# Patient Record
Sex: Female | Born: 1995 | Race: White | Hispanic: No | Marital: Single | Smoking: Former smoker
Health system: Southern US, Community
[De-identification: ages and names within clinical notes are randomized; demographics above are authoritative.]

## PROBLEM LIST (undated history)

## (undated) DIAGNOSIS — N611 Abscess of the breast and nipple: Secondary | ICD-10-CM

## (undated) DIAGNOSIS — M2669 Other specified disorders of temporomandibular joint: Secondary | ICD-10-CM

## (undated) DIAGNOSIS — F1920 Other psychoactive substance dependence, uncomplicated: Secondary | ICD-10-CM

## (undated) DIAGNOSIS — F32A Depression, unspecified: Secondary | ICD-10-CM

## (undated) DIAGNOSIS — F419 Anxiety disorder, unspecified: Secondary | ICD-10-CM

## (undated) DIAGNOSIS — M26649 Arthritis of unspecified temporomandibular joint: Secondary | ICD-10-CM

## (undated) DIAGNOSIS — B009 Herpesviral infection, unspecified: Secondary | ICD-10-CM

## (undated) DIAGNOSIS — F329 Major depressive disorder, single episode, unspecified: Secondary | ICD-10-CM

## (undated) DIAGNOSIS — R203 Hyperesthesia: Secondary | ICD-10-CM

## (undated) DIAGNOSIS — S43006A Unspecified dislocation of unspecified shoulder joint, initial encounter: Secondary | ICD-10-CM

---
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
## 2016-03-22 ENCOUNTER — Emergency Department (HOSPITAL_COMMUNITY)
Admission: EM | Admit: 2016-03-22 | Discharge: 2016-03-22 | Disposition: A | Discharge status: Home / Self Care | Payer: Medicaid Other | Attending: Emergency Medicine | Admitting: Emergency Medicine

## 2016-03-22 ENCOUNTER — Encounter (HOSPITAL_COMMUNITY): Payer: Self-pay | Admitting: Emergency Medicine

## 2016-03-22 DIAGNOSIS — Z791 Long term (current) use of non-steroidal anti-inflammatories (NSAID): Secondary | ICD-10-CM | POA: Insufficient documentation

## 2016-03-22 DIAGNOSIS — R45851 Suicidal ideations: Secondary | ICD-10-CM | POA: Insufficient documentation

## 2016-03-22 DIAGNOSIS — F1290 Cannabis use, unspecified, uncomplicated: Secondary | ICD-10-CM | POA: Insufficient documentation

## 2016-03-22 DIAGNOSIS — F17210 Nicotine dependence, cigarettes, uncomplicated: Secondary | ICD-10-CM | POA: Diagnosis not present

## 2016-03-22 DIAGNOSIS — Z79899 Other long term (current) drug therapy: Secondary | ICD-10-CM | POA: Insufficient documentation

## 2016-03-22 HISTORY — DX: Other specified disorders of temporomandibular joint: M26.69

## 2016-03-22 HISTORY — DX: Arthritis of unspecified temporomandibular joint: M26.649

## 2016-03-22 LAB — COMPREHENSIVE METABOLIC PANEL
ALT: 32 U/L (ref 14–54)
ANION GAP: 7 (ref 5–15)
AST: 23 U/L (ref 15–41)
Albumin: 4.4 g/dL (ref 3.5–5.0)
Alkaline Phosphatase: 118 U/L (ref 38–126)
BUN: <5 mg/dL — ABNORMAL LOW (ref 6–20)
CALCIUM: 8.9 mg/dL (ref 8.9–10.3)
CO2: 26 mmol/L (ref 22–32)
Chloride: 105 mmol/L (ref 101–111)
Creatinine, Ser: 0.78 mg/dL (ref 0.44–1.00)
GFR calc non Af Amer: >60 mL/min (ref >60)
Glucose, Bld: 85 mg/dL (ref 65–99)
Potassium: 3.1 mmol/L — ABNORMAL LOW (ref 3.5–5.1)
SODIUM: 138 mmol/L (ref 135–145)
Total Bilirubin: 1.1 mg/dL (ref 0.3–1.2)
Total Protein: 7.6 g/dL (ref 6.5–8.1)

## 2016-03-22 LAB — CBC
HCT: 44.1 % (ref 36.0–46.0)
Hemoglobin: 15.8 g/dL — ABNORMAL HIGH (ref 12.0–15.0)
MCH: 31.1 pg (ref 26.0–34.0)
MCHC: 35.8 g/dL (ref 30.0–36.0)
MCV: 86.8 fL (ref 78.0–100.0)
PLATELETS: 398 10*3/uL (ref 150–400)
RBC: 5.08 MIL/uL (ref 3.87–5.11)
RDW: 12.2 % (ref 11.5–15.5)
WBC: 10.1 10*3/uL (ref 4.0–10.5)

## 2016-03-22 LAB — SALICYLATE LEVEL

## 2016-03-22 LAB — RAPID URINE DRUG SCREEN, HOSP PERFORMED
Amphetamines: NOT DETECTED
Barbiturates: NOT DETECTED
Benzodiazepines: POSITIVE — AB
Cocaine: NOT DETECTED
OPIATES: NOT DETECTED
Tetrahydrocannabinol: POSITIVE — AB

## 2016-03-22 LAB — ETHANOL

## 2016-03-22 LAB — I-STAT BETA HCG BLOOD, ED (MC, WL, AP ONLY): I-stat hCG, quantitative: <5 m[IU]/mL (ref <5)

## 2016-03-22 LAB — ACETAMINOPHEN LEVEL: Acetaminophen (Tylenol), Serum: <10 ug/mL — ABNORMAL LOW (ref 10–30)

## 2016-03-22 MED ORDER — TRAZODONE HCL 100 MG PO TABS: 100.0000 mg | ORAL_TABLET | Freq: Every day | ORAL | Status: DC

## 2016-03-22 MED ORDER — HYDROXYZINE HCL 25 MG PO TABS: 25.0000 mg | ORAL_TABLET | Freq: Four times a day (QID) | ORAL | 0 refills | Status: DC

## 2016-03-22 MED ORDER — HYDROXYZINE HCL 25 MG PO TABS: 25.0000 mg | ORAL_TABLET | Freq: Four times a day (QID) | ORAL | Status: DC | PRN

## 2016-03-22 MED ORDER — TRAZODONE HCL 100 MG PO TABS: 100.0000 mg | ORAL_TABLET | Freq: Every day | ORAL | 0 refills | Status: DC

## 2016-03-22 MED ORDER — POTASSIUM CHLORIDE CRYS ER 20 MEQ PO TBCR
40.0000 meq | EXTENDED_RELEASE_TABLET | Freq: Once | ORAL | Status: AC
  Administered 2016-03-22: 40 meq via ORAL
  Filled 2016-03-22: qty 2

## 2016-03-22 NOTE — ED Notes (Signed)
Patient discharged to home.  Denies thoughts of harm to self or others.  States she has chronic anxiety and depression.  She was given resources for outpatient follow up.  All belongings returned and signed for.  She left the unit ambulatory and was escorted to the front lobby.

## 2016-03-22 NOTE — ED Triage Notes (Signed)
Pt reports she had SI earlier today, none at present. Has been under increased stress due to marital difficulties. Denies HI. Uses marijuana occasionally. Does not have mental health provider in the community.

## 2016-03-22 NOTE — ED Provider Notes (Signed)
[facility]-EMERGENCY DEPT Provider Note   CSN: [id number] Arrival date & time: 03/22/16  1528  First Provider Contact:  First MD Initiated Contact with Patient 03/22/16 1630        History   Chief Complaint Chief Complaint  Patient presents with  . Suicidal    HPI Kelli Wright is a 20 y.o. female.  HPI Kelli Wright is a 20 y.o. female with PMH significant for anxiety and depression who presents with sudden onset, severe, now resolved SI.  Patient reports increased marital stressors and earlier today she stated she did not want to be alive anymore if the relationship was over.  Denies suicidal plan.  No HI or AVH.  No other complaints.  Endorses occassional THC use.  She is requesting mental health resources.  Past Medical History:  Diagnosis Date  . Anxiety   . Depression   . Dislocation, shoulder closed 08/2013   left - injured in a fall  . Herpes   . Sensitive skin   . TMJ arthritis     There are no active problems to display for this patient.   Past Surgical History:  Procedure Laterality Date  . SHOULDER ARTHROSCOPY WITH BANKART REPAIR Left 11/22/2013   Procedure: LEFT SHOULDER ARTHROSCOPY WITH BANKART REPAIR/EXTENSIVE DEBRIDEMENT AND SUBACROMIAL DECOMPRESSION;  Surgeon: Sheral Apley, MD;  Location: [facility];  Service: Orthopedics;  Laterality: Left;    OB History    No data available       Home Medications    Prior to Admission medications   Medication Sig Start Date End Date Taking? Authorizing Provider  meloxicam (MOBIC) 7.5 MG tablet TAKE 1 TABLET BY MOUTH ONCE A DAY 03/09/16  Yes Historical Provider, MD  tiZANidine (ZANAFLEX) 2 MG tablet TAKE 1 TABLET BY MOUTH 3 TIMES A DAY AS NEEDED FOR TIGHT MUSCLES 03/09/16  Yes Historical Provider, MD  docusate sodium (COLACE) 100 MG capsule Take 1 capsule (100 mg total) by mouth 2 (two) times daily. Continue this while taking narcotics to help with bowel movements Patient not taking: Reported on  03/22/2016 11/22/13   Sheral Apley, MD  meloxicam (MOBIC) 15 MG tablet Take 1 tablet (15 mg total) by mouth daily. Patient not taking: Reported on 03/22/2016 07/23/15   Mercedes Camprubi-Soms, PA-C  naproxen (NAPROSYN) 500 MG tablet Take 1 tablet (500 mg total) by mouth 2 (two) times daily. Patient not taking: Reported on 03/22/2016 11/24/14   Joycie Peek, PA-C  ondansetron (ZOFRAN) 4 MG tablet Take 1 tablet (4 mg total) by mouth every 8 (eight) hours as needed for nausea. Patient not taking: Reported on 03/22/2016 11/22/13   Sheral Apley, MD  oxyCODONE (ROXICODONE) 5 MG immediate release tablet Take 2 tablets (10 mg total) by mouth every 4 (four) hours as needed for severe pain. Patient not taking: Reported on 03/22/2016 11/22/13   Sheral Apley, MD  triamcinolone cream (KENALOG) 0.1 % Apply 1 application topically 4 (four) times daily as needed. Patient not taking: Reported on 03/22/2016 11/24/14   Joycie Peek, PA-C    Family History Family History  Problem Relation Age of Onset  . Hypertension Paternal Grandmother     Social History Social History  Substance Use Topics  . Smoking status: Current Every Day Smoker    Packs/day: 0.50    Years: 3.00    Types: Cigarettes  . Smokeless tobacco: Never Used     Comment: 1 pack/week  . Alcohol use No  Comment:        Allergies   Keflex [cephalexin]   Review of Systems Review of Systems All other systems negative unless otherwise stated in HPI   Physical Exam Updated Vital Signs BP 126/82 (BP Location: Left Arm)   Pulse 99   Temp 98.2 F (36.8 C) (Oral)   Resp 18   SpO2 100%   Physical Exam  Constitutional: She is oriented to person, place, and time. She appears well-developed and well-nourished.  HENT:  Head: Normocephalic and atraumatic.  Right Ear: External ear normal.  Left Ear: External ear normal.  Eyes: Conjunctivae are normal. No scleral icterus.  Neck: No tracheal deviation present.  Cardiovascular:  Normal rate, regular rhythm and normal heart sounds.   Pulmonary/Chest: Effort normal and breath sounds normal. No respiratory distress.  Abdominal: Soft. Bowel sounds are normal. She exhibits no distension.  Musculoskeletal: Normal range of motion.  Neurological: She is alert and oriented to person, place, and time.  Skin: Skin is warm and dry.  Psychiatric: Her speech is normal and behavior is normal. Cognition and memory are normal. She expresses inappropriate judgment. She exhibits a depressed mood. She expresses suicidal ideation. She expresses no homicidal ideation. She expresses no suicidal plans and no homicidal plans.     ED Treatments / Results  Labs (all labs ordered are listed, but only abnormal results are displayed) Labs Reviewed  COMPREHENSIVE METABOLIC PANEL - Abnormal; Notable for the following:       Result Value   Potassium 3.1 (*)    BUN <5 (*)    All other components within normal limits  ACETAMINOPHEN LEVEL - Abnormal; Notable for the following:    Acetaminophen (Tylenol), Serum <10 (*)    All other components within normal limits  CBC - Abnormal; Notable for the following:    Hemoglobin 15.8 (*)    All other components within normal limits  URINE RAPID DRUG SCREEN, HOSP PERFORMED - Abnormal; Notable for the following:    Benzodiazepines POSITIVE (*)    Tetrahydrocannabinol POSITIVE (*)    All other components within normal limits  ETHANOL  SALICYLATE LEVEL  I-STAT BETA HCG BLOOD, ED ([facility], [facility], [facility] ONLY)    EKG  EKG Interpretation None       Radiology No results found.  Procedures Procedures (including critical care time)  Medications Ordered in ED Medications  hydrOXYzine (ATARAX/VISTARIL) tablet 25 mg (not administered)  traZODone (DESYREL) tablet 100 mg (not administered)  potassium chloride SA (K-DUR,KLOR-CON) CR tablet 40 mEq (40 mEq Oral Given 03/22/16 1708)     Initial Impression / Assessment and Plan / ED Course  I have reviewed the  triage vital signs and the nursing notes.  Pertinent labs & imaging results that were available during my care of the patient were reviewed by me and considered in my medical decision making (see chart for details).  Clinical Course   Patient with hx of anxiety and depression who presents with now resolved SI earlier today due to increased marital stressors.  No other complaints.  She does not have mental health resources.  Medical screening labs obtained and will consult TTS. K 3.1, repleted in ED.  Labs without acute abnormalities, patient medically cleared and moved to [facility] room.   Final Clinical Impressions(s) / ED Diagnoses   Final diagnoses:  Suicidal thoughts    New Prescriptions New Prescriptions   No medications on file     Cheri Fowler, Cordelia Poche 03/22/16 1725    Bethann Berkshire, MD  03/22/16 2309  

## 2016-03-22 NOTE — [facility] Counselor (Signed)
Writer discussed pt with Nanine Means DNP who recommends outpatient MH treatment as pt doesn't meet inpatient criteria. Writer notified [facility]. Writer then discussed MH treatment options with pt and gave pt contact info for [facility], [place] and other outpatient facilities. Pt will be d/c after shift change.   Evette Cristal, [place] Therapeutic Triage Specialist

## 2016-03-22 NOTE — Discharge Instructions (Signed)
Please use the resources provided to establish outpatient care.  You may start taking Trazodone at bedtime daily and Atarax every 6 hours as needed for anxiety.  Return to the ED if you experience increased suicidal ideations, plan, or feeling like you want to hurt others.
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
## 2016-05-12 ENCOUNTER — Encounter (HOSPITAL_COMMUNITY): Payer: Self-pay

## 2016-05-12 ENCOUNTER — Observation Stay (HOSPITAL_COMMUNITY)
Admission: RE | Admit: 2016-05-12 | Discharge: 2016-05-13 | Disposition: A | Discharge status: Home / Self Care | Payer: Medicare Other | Attending: Psychiatry | Admitting: Psychiatry

## 2016-05-12 DIAGNOSIS — F410 Panic disorder [episodic paroxysmal anxiety] without agoraphobia: Secondary | ICD-10-CM | POA: Insufficient documentation

## 2016-05-12 DIAGNOSIS — M1388 Other specified arthritis, other site: Secondary | ICD-10-CM | POA: Insufficient documentation

## 2016-05-12 DIAGNOSIS — F17210 Nicotine dependence, cigarettes, uncomplicated: Secondary | ICD-10-CM | POA: Insufficient documentation

## 2016-05-12 DIAGNOSIS — R45851 Suicidal ideations: Secondary | ICD-10-CM | POA: Diagnosis not present

## 2016-05-12 DIAGNOSIS — Z881 Allergy status to other antibiotic agents status: Secondary | ICD-10-CM | POA: Diagnosis not present

## 2016-05-12 DIAGNOSIS — F332 Major depressive disorder, recurrent severe without psychotic features: Secondary | ICD-10-CM | POA: Diagnosis not present

## 2016-05-12 MED ORDER — MELOXICAM 7.5 MG PO TABS
7.5000 mg | ORAL_TABLET | Freq: Every day | ORAL | Status: DC
  Administered 2016-05-12 – 2016-05-13 (×2): 7.5 mg via ORAL
  Filled 2016-05-12 (×2): qty 1

## 2016-05-12 MED ORDER — TRAZODONE HCL 100 MG PO TABS
100.0000 mg | ORAL_TABLET | Freq: Every evening | ORAL | Status: DC | PRN
  Administered 2016-05-12: 100 mg via ORAL
  Filled 2016-05-12: qty 1

## 2016-05-12 MED ORDER — ACETAMINOPHEN 325 MG PO TABS
650.0000 mg | ORAL_TABLET | Freq: Four times a day (QID) | ORAL | Status: DC | PRN
  Administered 2016-05-12: 650 mg via ORAL
  Filled 2016-05-12: qty 2

## 2016-05-12 MED ORDER — MAGNESIUM HYDROXIDE 400 MG/5ML PO SUSP: 30.0000 mL | Freq: Every day | ORAL | Status: DC | PRN

## 2016-05-12 MED ORDER — ALUM & MAG HYDROXIDE-SIMETH 200-200-20 MG/5ML PO SUSP: 30.0000 mL | ORAL | Status: DC | PRN

## 2016-05-12 MED ORDER — NAPROXEN 500 MG PO TABS
500.0000 mg | ORAL_TABLET | Freq: Two times a day (BID) | ORAL | Status: DC
  Administered 2016-05-12 – 2016-05-13 (×2): 500 mg via ORAL
  Filled 2016-05-12 (×2): qty 1

## 2016-05-12 NOTE — Progress Notes (Signed)
Pt awake and alert on unit in bed 6.  Pt friendly and cooperative.  Pt has good eye contact and answers questions and smiles.  Pt sts she does feel depressed and anxious.  Pt denies SI plan, HI and AVH.  Pt verbally contracts for safety.  No self injurious behavior noted.  Pt confirms mild discomfort with recent wisdom tooth extraction.   Pt continuously observed for safety on unit except when in bathroom. Pt resting and watching TV and using phone.

## 2016-05-12 NOTE — BHH Counselor (Signed)
This Clinical research associatewriter spoke with the patient in regards to her admission. Patient informed this Clinical research associatewriter that she has been feeling depressed and feeling anxiety. Along with having passive suicidal ideation and relationship issues with her spouse. This patient also reports that she does not currently have a therapist or psychiatrist. This patient is receptive to following up with a therapist or psychiatrist post discharge.
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
## 2016-05-12 NOTE — H&P (Signed)
Behavioral Health Medical Screening Exam  Margarita SermonsHarleigh D Wright is an 20 y.o. female.  Total Time spent with patient: 15 minutes  Psychiatric Specialty Exam: Physical Exam  Review of Systems  Psychiatric/Behavioral: Positive for depression and suicidal ideas (intermittent). Negative for hallucinations and substance abuse. The patient is nervous/anxious and has insomnia.   All other systems reviewed and are negative.   Blood pressure 113/80, pulse 96, temperature 98.4 F (36.9 C), temperature source Oral, resp. rate 16, height 5\' 3"  (1.6 m), weight 80.7 kg (178 lb), SpO2 100 %.Body mass index is 31.53 kg/m.  General Appearance: Casual and Fairly Groomed  Eye Contact:  Good  Speech:  Clear and Coherent and Normal Rate  Volume:  Normal  Mood:  Anxious and Depressed  Affect:  Appropriate, Congruent and Depressed  Thought Process:  Coherent, Goal Directed, Linear and Descriptions of Associations: Intact  Orientation:  Full (Time, Place, and Person)  Thought Content:  Anxiety symptoms  Suicidal Thoughts:  intermittent  Homicidal Thoughts:  No  Memory:  Immediate;   Fair Recent;   Fair Remote;   Fair  Judgement:  Fair  Insight:  Fair  Psychomotor Activity:  Normal  Concentration: Concentration: Fair and Attention Span: Fair  Recall:  [employer]Fair  Fund of Knowledge:Fair  Language: Fair  Akathisia:  No  Handed:    AIMS (if indicated):     Assets:  Communication Skills Desire for Improvement Physical Health Resilience Social Support  Sleep:       Musculoskeletal: Strength & Muscle Tone: within normal limits Gait & Station: normal Patient leans: N/A  Blood pressure 113/80, pulse 96, temperature 98.4 F (36.9 C), temperature source Oral, resp. rate 16, height 5\' 3"  (1.6 m), weight 80.7 kg (178 lb), SpO2 100 %.  Recommendations:  Based on my evaluation the patient does not appear to have an emergency medical condition.  Beau FannyWithrow, John C, FNP 05/12/2016, 6:24 PM   Agree with NP note  and assessment

## 2016-05-13 DIAGNOSIS — F332 Major depressive disorder, recurrent severe without psychotic features: Principal | ICD-10-CM

## 2016-05-13 MED ORDER — TRAZODONE HCL 100 MG PO TABS: 100.0000 mg | ORAL_TABLET | Freq: Every evening | ORAL | 0 refills | Status: DC | PRN

## 2016-05-13 NOTE — BHH Counselor (Signed)
This pt has a follow up appointment scheduled for today at 3pm with [facility] with Tilden FossaQuandra Smith (Therapist).

## 2016-05-13 NOTE — Discharge Summary (Signed)
[facility] OBS UNIT DISCHARGE SUMMARY (pt seen once due to short LOS)  Patient Identification: Kelli Wright MRN:  [id number] Principal Diagnosis: MDD (major depressive disorder), recurrent severe, without psychosis (HCC) Diagnosis:   Patient Active Problem List   Diagnosis Date Noted  . MDD (major depressive disorder), recurrent severe, without psychosis (HCC) [F33.2] 05/12/2016    Priority: High    Total Time spent with patient: 45 minutes  Discharge update: Pt has a psychiatry appointment today at 3pm and will discharge now to make this appointment.   Subjective:   Kelli Wright is a 20 y.o. female patient admitted with severe anxiety and passive intermittent suicidal thoughts but able to contract for safety. Pt seen and chart reviewed. Pt is alert/oriented x4, calm, cooperative, and appropriate to situation. Pt denies suicidal/homicidal ideation and psychosis and does not appear to be responding to internal stimuli. Pt arrived late yesterday and spent the night in the [facility] OBS UNIT without incident. Pt reports that she slept well and feels much better today.   HPI:  I have reviewed and concur with HPI elements below, modified as follows: Kelli Wright is a 20 y.o. female with a history of depressive and anxious symptoms who presented as a voluntary walk-in to [facility] with complaint of passive suicidal ideation and other depressive symptoms.  Pt reported as follows:  Pt stated she has "always had" depression and anxiety, and that over the last month, her symptoms have become exacerbated due to marital conflict.  Pt endorsed passive suicidal ideation (generalized desire to be dead), persistent and unremitting sadness, tearfulness, feelings of hopelessness and worthlessness, fatigue, guilt, and worthlessness.  In addition to these symptoms, Pt endorsed anxiety and said that she has panic attacks (most recent panic attack was 05/12/16).  Pt presented today to [facility] today after her husband threatened to  leave her.    Pt is not currently in treatment for these symptoms.  She stated that she has been treated outpatient previously.  She denied any current psychotropic medication.  During assessment, Pt was dressed in street clothes and appeared appropriately groomed.  She had good eye contact and was cooperative.  Pt's mood was depressed, and she was tearful in affect.  Pt reported passive suicidal ideation, and she stated that she attempted suicide once before.  Pt also endorsed depressive and anxious symptoms (see above).  Pt denied auditory/visual hallucination and homicidal ideation.  She endorsed a history of cutting, but denied any recent self-injury.  Likewise, she reported a history of marijuana use, but denied current use.  Pt's speech was normal in rate, rhythm, and volume.  Memory and concentration were intact.  Pt's thought processes were within normal range.  Thought content was goal-oriented.  There was no evidence of delusion.  Pt's insight, judgment, and impulse control were fair.  Pt spent the night in [facility] OBS UNIT as a late arrival yesterday. Pt has been participating in the milieu and evaluated as above.   Past Psychiatric History: MDD  Risk to Self: Suicidal Ideation: No Suicidal Intent: No Is patient at risk for suicide?: Yes Suicidal Plan?: No Access to Means: No What has been your use of drugs/alcohol within the last 12 months?: Marijuana How many times?: 1 Other Self Harm Risks: Drug use hx Triggers for Past Attempts: Spouse contact Intentional Self Injurious Behavior: Cutting Comment - Self Injurious Behavior: Hx of cutting Risk to Others: Homicidal Ideation: No Thoughts of Harm to Others: No Current Homicidal Intent: No Current Homicidal  Plan: No Access to Homicidal Means: No History of harm to others?: No Assessment of Violence: None Noted Does patient have access to weapons?: No Criminal Charges Pending?: Yes Describe Pending Criminal Charges: Possession of  THC Does patient have a court date: Yes Court Date: 05/31/16 Prior Inpatient Therapy: Prior Inpatient Therapy: No Prior Outpatient Therapy: Prior Outpatient Therapy: Yes Prior Therapy Dates: Unknown Prior Therapy Facilty/Provider(s): [facility]; [facility] Reason for Treatment: Depression, Anxiety Does patient have an ACCT team?: No Does patient have Intensive In-House Services?  : No Does patient have [facility] services? : No Does patient have P4CC services?: No  Past Medical History:  Past Medical History:  Diagnosis Date  . Anxiety   . Depression   . Dislocation, shoulder closed 08/2013   left - injured in a fall  . Herpes   . Sensitive skin   . TMJ arthritis     Past Surgical History:  Procedure Laterality Date  . SHOULDER ARTHROSCOPY WITH BANKART REPAIR Left 11/22/2013   Procedure: LEFT SHOULDER ARTHROSCOPY WITH BANKART REPAIR/EXTENSIVE DEBRIDEMENT AND SUBACROMIAL DECOMPRESSION;  Surgeon: Sheral Apleyimothy D Murphy, MD;  Location: [facility];  Service: Orthopedics;  Laterality: Left;   Family History:  Family History  Problem Relation Age of Onset  . Hypertension Paternal Grandmother    Family Psychiatric  History: MDD Social History:  History  Alcohol Use No    Comment:        History  Drug Use  . Types: Marijuana    Comment: States she recently quit but did use daily    Social History   Social History  . Marital status: Single    Spouse name: N/A  . Number of children: N/A  . Years of education: N/A   Social History Main Topics  . Smoking status: Current Every Day Smoker    Packs/day: 0.25    Years: 3.00    Types: Cigarettes  . Smokeless tobacco: Never Used     Comment: 1 pack/week  . Alcohol use No     Comment:     . Drug use:     Types: Marijuana     Comment: States she recently quit but did use daily  . Sexual activity: Yes    Partners: Male    Birth control/ protection: Condom   Other Topics Concern  . None   Social History Narrative    Paternal grandmother is legal guardian; to bring guardianship papers DOS   Additional Social History:    Allergies:   Allergies  Allergen Reactions  . Keflex [Cephalexin] Rash    Labs: No results found for this or any previous visit (from the past 48 hour(s)).  Current Facility-Administered Medications  Medication Dose Route Frequency Provider Last Rate Last Dose  . acetaminophen (TYLENOL) tablet 650 mg  650 mg Oral Q6H PRN Beau FannyJohn C Withrow, FNP   650 mg at 05/12/16 2205  . alum & mag hydroxide-simeth (MAALOX/MYLANTA) 200-200-20 MG/5ML suspension 30 mL  30 mL Oral Q4H PRN Beau FannyJohn C Withrow, FNP      . magnesium hydroxide (MILK OF MAGNESIA) suspension 30 mL  30 mL Oral Daily PRN Beau FannyJohn C Withrow, FNP      . meloxicam (MOBIC) tablet 7.5 mg  7.5 mg Oral Daily Beau FannyJohn C Withrow, FNP   7.5 mg at 05/13/16 [id number]  . naproxen (NAPROSYN) tablet 500 mg  500 mg Oral BID Beau FannyJohn C Withrow, FNP   500 mg at 05/13/16 [id number]  . traZODone (DESYREL) tablet 100 mg  100 mg Oral QHS PRN Jackelyn Poling, NP   100 mg at 05/12/16 2205    Musculoskeletal: Strength & Muscle Tone: within normal limits Gait & Station: normal Patient leans: N/A  Psychiatric Specialty Exam: Physical Exam  ROS  Blood pressure 109/68, pulse (!) 129, temperature 98.2 F (36.8 C), resp. rate 18, height 5\' 3"  (1.6 m), weight 80.7 kg (178 lb), SpO2 100 %.Body mass index is 31.53 kg/m.  General Appearance: Casual and Fairly Groomed  Eye Contact:  Good  Speech:  Clear and Coherent and Normal Rate  Volume:  Normal  Mood:  Depressed  Affect:  Appropriate, Congruent and Depressed  Thought Process:  Coherent, Goal Directed, Linear and Descriptions of Associations: Intact  Orientation:  Full (Time, Place, and Person)  Thought Content:  Discharge plans  Suicidal Thoughts:  No  Homicidal Thoughts:  No  Memory:  Immediate;   Fair Recent;   Fair Remote;   Fair  Judgement:  Fair  Insight:  Fair  Psychomotor Activity:  Normal  Concentration:   Concentration: Fair and Attention Span: Fair  Recall:  [employer] of Knowledge:  Fair  Language:  Fair  Akathisia:  No  Handed:    AIMS (if indicated):     Assets:  Communication Skills Desire for Improvement Resilience Social Support Talents/Skills  ADL's:  Intact  Cognition:  WNL  Sleep:      Treatment Plan Summary: MDD (major depressive disorder), recurrent severe, without psychosis (HCC)  Stable for management outpatient. Continue plan as below:  Medications:  -Continue Trazodone 100mg  po qhs (rx for 14 days given)  Disposition: -Discharge home -Followup with outpatient psychiatry (appointment today at 3pm)  Beau Fanny, FNP 05/13/2016 12:26 PM    Agree with NP discharge note as above

## 2016-05-13 NOTE — Progress Notes (Signed)
D: Pt A & O X4. Denies SI, HI, AVH and pain at present. Presents with depressed affect and mood. Reported being worried about her husband who was recently diagnosed with Bipolar last week but is refusing to take medications. Ambulatory with a steady gait. Pt discharged from Observation Unit as ordered and was picked up by family in the lobby.  A: Emotional support and availability provided to pt. Encouraged to voice concerns and comply with d/c instructions. Scheduled medications administered as prescribed. D/C instructions, prescriptions and AVS reviewed with pt. All belongings in locker 41 returned to pt at time of departure. Safety maintained in Observation unit till time of d/c without self harm gestures or outburst to note at present.  R: Pt receptive to care. Compliant with medications as ordered. Denies adverse drug reactions when assessed. Tolerates all PO intake well. Verbalized understanding related to d/c instructions. Signed belonging sheet in agreement with items received from locker. No physical distress evident at time of d/c.

## 2016-05-13 NOTE — H&P (Signed)
[facility] OBS UNIT H&P   Patient Identification: Kelli Wright MRN:  [id number] Principal Diagnosis: MDD (major depressive disorder), recurrent severe, without psychosis (HCC) Diagnosis:   Patient Active Problem List   Diagnosis Date Noted  . MDD (major depressive disorder), recurrent severe, without psychosis (HCC) [F33.2] 05/12/2016    Priority: High    Total Time spent with patient: 45 minutes  Subjective:   Kelli SermonsHarleigh D Sporn is a 20 y.o. female patient admitted with severe anxiety and passive intermittent suicidal thoughts but able to contract for safety. Pt seen and chart reviewed. Pt is alert/oriented x4, calm, cooperative, and appropriate to situation. Pt denies suicidal/homicidal ideation and psychosis and does not appear to be responding to internal stimuli. Pt arrived late yesterday [facility]. Pt reports that she slept well and feels much better today.   HPI:  I have reviewed and concur with HPI elements below, modified as follows: Kelli SermonsHarleigh D Mroz is a 20 y.o. female with a history of depressive and anxious symptoms who [facility] complaint of passive suicidal ideation and other depressive symptoms.  Pt reported as follows:  Pt stated she has "always had" depression and anxiety, and that over the last month, her symptoms have become exacerbated due to marital conflict.  Pt endorsed passive suicidal ideation (generalized desire to be dead), persistent and unremitting sadness, tearfulness, feelings of hopelessness and worthlessness, fatigue, guilt, and worthlessness.  In addition to these symptoms, Pt endorsed anxiety and said that she has panic attacks (most recent panic attack was 05/12/16).  [facility] after her husband threatened to leave her.    Pt is not currently in treatment for these symptoms.  She stated that she has been treated outpatient previously.  She denied any current  psychotropic medication.  During assessment, Pt was dressed in street clothes and appeared appropriately groomed.  She had good eye contact and was cooperative.  Pt's mood was depressed, and she was tearful in affect.  Pt reported passive suicidal ideation, and she stated that she attempted suicide once before.  Pt also endorsed depressive and anxious symptoms (see above).  Pt denied auditory/visual hallucination and homicidal ideation.  She endorsed a history of cutting, but denied any recent self-injury.  Likewise, she reported a history of marijuana use, but denied current use.  Pt's speech was normal in rate, rhythm, and volume.  Memory and concentration were intact.  Pt's thought processes were within normal range.  Thought content was goal-oriented.  There was no evidence of delusion.  Pt's insight, judgment, and impulse control were fair.  [facility]. Pt has been participating in the milieu and evaluated as above.   Past Psychiatric History: MDD  Risk to Self: Suicidal Ideation: No Suicidal Intent: No Is patient at risk for suicide?: Yes Suicidal Plan?: No Access to Means: No What has been your use of drugs/alcohol within the last 12 months?: Marijuana How many times?: 1 Other Self Harm Risks: Drug use hx Triggers for Past Attempts: Spouse contact Intentional Self Injurious Behavior: Cutting Comment - Self Injurious Behavior: Hx of cutting Risk to Others: Homicidal Ideation: No Thoughts of Harm to Others: No Current Homicidal Intent: No Current Homicidal Plan: No Access to Homicidal Means: No History of harm to others?: No Assessment of Violence: None Noted Does patient have access to weapons?: No Criminal Charges  Pending?: Yes Describe Pending Criminal Charges: Possession of THC Does patient have a court date: Yes Court Date: 05/31/16 Prior Inpatient Therapy: Prior Inpatient Therapy: No Prior Outpatient Therapy: Prior  Outpatient Therapy: Yes Prior Therapy Dates: Unknown Prior Therapy Facilty/Provider(s): [facility]; [facility] Reason for Treatment: Depression, Anxiety Does patient have an ACCT team?: No Does patient have Intensive In-House Services?  : No Does patient have [facility] services? : No Does patient have P4CC services?: No  Past Medical History:  Past Medical History:  Diagnosis Date  . Anxiety   . Depression   . Dislocation, shoulder closed 08/2013   left - injured in a fall  . Herpes   . Sensitive skin   . TMJ arthritis     Past Surgical History:  Procedure Laterality Date  . SHOULDER ARTHROSCOPY WITH BANKART REPAIR Left 11/22/2013   Procedure: LEFT SHOULDER ARTHROSCOPY WITH BANKART REPAIR/EXTENSIVE DEBRIDEMENT AND SUBACROMIAL DECOMPRESSION;  Surgeon: Sheral Apley, MD;  Location: [facility];  Service: Orthopedics;  Laterality: Left;   Family History:  Family History  Problem Relation Age of Onset  . Hypertension Paternal Grandmother    Family Psychiatric  History: MDD Social History:  History  Alcohol Use No    Comment:        History  Drug Use  . Types: Marijuana    Comment: States she recently quit but did use daily    Social History   Social History  . Marital status: Single    Spouse name: N/A  . Number of children: N/A  . Years of education: N/A   Social History Main Topics  . Smoking status: Current Every Day Smoker    Packs/day: 0.25    Years: 3.00    Types: Cigarettes  . Smokeless tobacco: Never Used     Comment: 1 pack/week  . Alcohol use No     Comment:     . Drug use:     Types: Marijuana     Comment: States she recently quit but did use daily  . Sexual activity: Yes    Partners: Male    Birth control/ protection: Condom   Other Topics Concern  . None   Social History Narrative   Paternal grandmother is legal guardian; to bring guardianship papers DOS   Additional Social History:    Allergies:   Allergies  Allergen  Reactions  . Keflex [Cephalexin] Rash    Labs: No results found for this or any previous visit (from the past 48 hour(s)).  Current Facility-Administered Medications  Medication Dose Route Frequency Provider Last Rate Last Dose  . acetaminophen (TYLENOL) tablet 650 mg  650 mg Oral Q6H PRN Beau Fanny, FNP   650 mg at 05/12/16 2205  . alum & mag hydroxide-simeth (MAALOX/MYLANTA) 200-200-20 MG/5ML suspension 30 mL  30 mL Oral Q4H PRN Beau Fanny, FNP      . magnesium hydroxide (MILK OF MAGNESIA) suspension 30 mL  30 mL Oral Daily PRN Beau Fanny, FNP      . meloxicam (MOBIC) tablet 7.5 mg  7.5 mg Oral Daily Beau Fanny, FNP   7.5 mg at 05/13/16 1610  . naproxen (NAPROSYN) tablet 500 mg  500 mg Oral BID Beau Fanny, FNP   500 mg at 05/13/16 9604  . traZODone (DESYREL) tablet 100 mg  100 mg Oral QHS PRN Jackelyn Poling, NP   100 mg at 05/12/16 2205    Musculoskeletal: Strength & Muscle Tone: within normal limits  Gait & Station: normal Patient leans: N/A  Psychiatric Specialty Exam: Physical Exam  ROS  Blood pressure 109/68, pulse (!) 129, temperature 98.2 F (36.8 C), resp. rate 18, height 5\' 3"  (1.6 m), weight 80.7 kg (178 lb), SpO2 100 %.Body mass index is 31.53 kg/m.  General Appearance: Casual and Fairly Groomed  Eye Contact:  Good  Speech:  Clear and Coherent and Normal Rate  Volume:  Normal  Mood:  Depressed  Affect:  Appropriate, Congruent and Depressed  Thought Process:  Coherent, Goal Directed, Linear and Descriptions of Associations: Intact  Orientation:  Full (Time, Place, and Person)  Thought Content:  Discharge plans  Suicidal Thoughts:  No  Homicidal Thoughts:  No  Memory:  Immediate;   Fair Recent;   Fair Remote;   Fair  Judgement:  Fair  Insight:  Fair  Psychomotor Activity:  Normal  Concentration:  Concentration: Fair and Attention Span: Fair  Recall:  [employer] of Knowledge:  Fair  Language:  Fair  Akathisia:  No  Handed:    AIMS (if  indicated):     Assets:  Communication Skills Desire for Improvement Resilience Social Support Talents/Skills  ADL's:  Intact  Cognition:  WNL  Sleep:      Treatment Plan Summary: MDD (major depressive disorder), recurrent severe, without psychosis (HCC)  Stable for management outpatient. Continue plan as below:  Medications:  -Continue Trazodone 100mg  po qhs  Disposition: SW seeking outpatient followup for pt at this time. Will discharge today after this is set up.   Beau Fanny, FNP 05/13/2016 9:09 AM  Agree with NP note and assessment as above

## 2016-05-13 NOTE — [facility] Counselor (Signed)
This Clinical research associatewriter spoke with pt in regards to obtaining written consent to fax over her paperwork to [facility] for a follow up appointment post discharge.

## 2017-04-29 ENCOUNTER — Ambulatory Visit (INDEPENDENT_AMBULATORY_CARE_PROVIDER_SITE_OTHER): Payer: Self-pay | Admitting: Medical

## 2017-04-29 ENCOUNTER — Encounter: Payer: Self-pay | Admitting: Medical

## 2017-04-29 VITALS — BP 104/75 | HR 81 | Temp 99.2°F | Wt 143.0 lb

## 2017-04-29 DIAGNOSIS — J019 Acute sinusitis, unspecified: Secondary | ICD-10-CM

## 2017-04-29 DIAGNOSIS — J028 Acute pharyngitis due to other specified organisms: Secondary | ICD-10-CM

## 2017-04-29 DIAGNOSIS — B9789 Other viral agents as the cause of diseases classified elsewhere: Secondary | ICD-10-CM

## 2017-04-29 LAB — POCT RAPID STREP A (OFFICE)

## 2017-04-29 MED ORDER — AMOXICILLIN-POT CLAVULANATE 875-125 MG PO TABS: 1.0000 | ORAL_TABLET | Freq: Two times a day (BID) | ORAL | 0 refills | Status: DC

## 2017-04-29 MED ORDER — BENZONATATE 100 MG PO CAPS: 100.0000 mg | ORAL_CAPSULE | Freq: Two times a day (BID) | ORAL | 0 refills | Status: DC | PRN

## 2017-04-29 NOTE — Patient Instructions (Signed)
Over the Counter medications that may help your symptoms:  Mucinex D or Zyrtec D Tylenol or Ibuprofen  Saline nasal Spray Cough Drops Chloraseptic Throat Spray  Gets lots of rest and drink plenty of water   Sinusitis, Adult Sinusitis is soreness and inflammation of your sinuses. Sinuses are hollow spaces in the bones around your face. They are located:  Around your eyes.  In the middle of your forehead.  Behind your nose.  In your cheekbones.  Your sinuses and nasal passages are lined with a stringy fluid (mucus). Mucus normally drains out of your sinuses. When your nasal tissues get inflamed or swollen, the mucus can get trapped or blocked so air cannot flow through your sinuses. This lets bacteria, viruses, and funguses grow, and that leads to infection. Follow these instructions at home: Medicines  Take, use, or apply over-the-counter and prescription medicines only as told by your doctor. These may include nasal sprays.  If you were prescribed an antibiotic medicine, take it as told by your doctor. Do not stop taking the antibiotic even if you start to feel better. Hydrate and Humidify  Drink enough water to keep your pee (urine) clear or pale yellow.  Use a cool mist humidifier to keep the humidity level in your home above 50%.  Breathe in steam for 10-15 minutes, 3-4 times a day or as told by your doctor. You can do this in the bathroom while a hot shower is running.  Try not to spend time in cool or dry air. Rest  Rest as much as possible.  Sleep with your head raised (elevated).  Make sure to get enough sleep each night. General instructions  Put a warm, moist washcloth on your face 3-4 times a day or as told by your doctor. This will help with discomfort.  Wash your hands often with soap and water. If there is no soap and water, use hand sanitizer.  Do not smoke. Avoid being around people who are smoking (secondhand smoke).  Keep all follow-up visits as  told by your doctor. This is important. Contact a doctor if:  You have a fever.  Your symptoms get worse.  Your symptoms do not get better within 10 days. Get help right away if:  You have a very bad headache.  You cannot stop throwing up (vomiting).  You have pain or swelling around your face or eyes.  You have trouble seeing.  You feel confused.  Your neck is stiff.  You have trouble breathing. This information is not intended to replace advice given to you by your health care provider. Make sure you discuss any questions you have with your health care provider. Document Released: 01/26/2008 Document Revised: 04/04/2016 Document Reviewed: 06/04/2015 Elsevier Interactive Patient Education  [employer]2018 [employer]

## 2017-04-29 NOTE — Progress Notes (Signed)
History:  [name] is a 21 y.o. female who presents to clinic today for URI symptoms with cough x 3 days. The patient states worsening symptoms today. She denies fever, but has had chills. She endorses nasal congestion, nasal drainage, sinus pressure/pain, productive cough and sore throat. She has noted diffuse chest pain with coughing.    The following portions of the patient's history were reviewed and updated as appropriate: allergies, current medications, family history, past medical history, social history, past surgical history and problem list.  Review of Systems:  Review of Systems  Constitutional: Positive for chills and malaise/fatigue. Negative for fever.  HENT: Positive for congestion, ear pain, sinus pain and sore throat. Negative for ear discharge.   Respiratory: Positive for cough and sputum production. Negative for shortness of breath.   Cardiovascular: Positive for chest pain.  Gastrointestinal: Negative for abdominal pain.  Neurological: Positive for headaches.      Objective:  Physical Exam BP 104/75 (BP Location: Right Arm, Patient Position: Sitting, Cuff Size: Normal)   Pulse 81   Temp 99.2 F (37.3 C) (Oral)   Wt 143 lb (64.9 kg)   SpO2 98%   BMI 25.33 kg/m  Physical Exam  Constitutional: She is oriented to person, place, and time. She appears well-developed and well-nourished. No distress.  HENT:  Head: Normocephalic and atraumatic.  Right Ear: External ear normal.  Left Ear: External ear normal.  Nose: Mucosal edema and rhinorrhea present. Right sinus exhibits maxillary sinus tenderness and frontal sinus tenderness. Left sinus exhibits maxillary sinus tenderness and frontal sinus tenderness.  Mouth/Throat: Uvula is midline and mucous membranes are normal. Posterior oropharyngeal erythema present. No oropharyngeal exudate, posterior oropharyngeal edema or tonsillar abscesses.  Eyes: EOM are normal.  Neck: Normal range of motion. Neck supple.   Cardiovascular: Normal rate, regular rhythm and normal heart sounds.   No murmur heard. Pulmonary/Chest: Effort normal and breath sounds normal. No respiratory distress. She has no wheezes. She exhibits no tenderness.  Abdominal: Soft. She exhibits no distension.  Neurological: She is alert and oriented to person, place, and time.  Skin: Skin is warm and dry. No erythema.  Psychiatric: She has a normal mood and affect.   Labs and Imaging Results for orders placed or performed in visit on 04/29/17 (from the past 24 hour(s))  POCT rapid strep A     Status: Normal   Collection Time: 04/29/17 11:45 AM  Result Value Ref Range   Rapid Strep A Screen  Negative     Assessment & Plan:  Acute Bacterial Rhinosinusitis  - Rx for Augmentin and Tessalon pearls sent to patient's pharmacy of choice. Confirmed that patient has taken Penicillins in the past without any reaction given allergy (rash) to Keflex - Discussed increased rest and hydration - OTC medication recommended for symptomatic relief and included on AVS - Warning signs for worsening condition discussed - [facility] or see PCP/ER with symptoms that persist or worsen  Marny LowensteinWenzel, Roosvelt Churchwell N, PA-C 04/29/2017 11:43 AM

## 2017-05-01 ENCOUNTER — Telehealth: Payer: Self-pay

## 2017-05-01 NOTE — Telephone Encounter (Signed)
Called patient -   Patient advise that she was feeling better except for the nagging cough that has been keeping her up at night. She states she had taken OTC Robitussin DM but had to take Ny Quil last night in order to get some rest due to the [employer] Pear[employer] not working. She states she would like something stronger for the cough to get better rest at night.  Advised patient I would route her request to a provider and contact once the provider reviews and responds to her request.

## 2017-05-01 NOTE — Telephone Encounter (Signed)
Called patient - advised of provider recommendations. Patient stated she understood and had no there questions.

## 2017-05-01 NOTE — Telephone Encounter (Signed)
Contact patient to advise her to purchase over the counter Delsym and take 10 ml every 12 hours as needed for cough. She can also continue tessalon pearls as adjunctive therapy for her cough.  Godfrey PickKimberly S. Tiburcio PeaHarris, MSN, FNP-C [address]  [place], [place] [address] [phone]

## 2017-05-02 ENCOUNTER — Telehealth: Payer: Self-pay

## 2017-08-13 ENCOUNTER — Encounter (HOSPITAL_COMMUNITY): Payer: Self-pay | Admitting: Emergency Medicine

## 2017-08-13 ENCOUNTER — Emergency Department (HOSPITAL_COMMUNITY)
Admission: EM | Admit: 2017-08-13 | Discharge: 2017-08-14 | Disposition: A | Discharge status: Home / Self Care | Payer: Self-pay | Attending: Emergency Medicine | Admitting: Emergency Medicine

## 2017-08-13 ENCOUNTER — Emergency Department (HOSPITAL_COMMUNITY): Payer: Self-pay

## 2017-08-13 DIAGNOSIS — F332 Major depressive disorder, recurrent severe without psychotic features: Secondary | ICD-10-CM | POA: Insufficient documentation

## 2017-08-13 DIAGNOSIS — F419 Anxiety disorder, unspecified: Secondary | ICD-10-CM

## 2017-08-13 DIAGNOSIS — F329 Major depressive disorder, single episode, unspecified: Secondary | ICD-10-CM

## 2017-08-13 DIAGNOSIS — F32A Depression, unspecified: Secondary | ICD-10-CM

## 2017-08-13 DIAGNOSIS — F1910 Other psychoactive substance abuse, uncomplicated: Secondary | ICD-10-CM | POA: Insufficient documentation

## 2017-08-13 DIAGNOSIS — Z79899 Other long term (current) drug therapy: Secondary | ICD-10-CM | POA: Insufficient documentation

## 2017-08-13 DIAGNOSIS — R4689 Other symptoms and signs involving appearance and behavior: Secondary | ICD-10-CM | POA: Diagnosis present

## 2017-08-13 LAB — CBC WITH DIFFERENTIAL/PLATELET
BASOS ABS: 0 10*3/uL (ref 0.0–0.1)
Basophils Relative: 0 %
EOS ABS: 0 10*3/uL (ref 0.0–0.7)
Eosinophils Relative: 0 %
HEMATOCRIT: 41.8 % (ref 36.0–46.0)
HEMOGLOBIN: 15.2 g/dL — AB (ref 12.0–15.0)
Lymphocytes Relative: 12 %
Lymphs Abs: 1.8 10*3/uL (ref 0.7–4.0)
MCH: 32.5 pg (ref 26.0–34.0)
MCHC: 36.4 g/dL — ABNORMAL HIGH (ref 30.0–36.0)
MCV: 89.5 fL (ref 78.0–100.0)
Monocytes Absolute: 1.1 10*3/uL — ABNORMAL HIGH (ref 0.1–1.0)
Monocytes Relative: 7 %
NEUTROS ABS: 12 10*3/uL — AB (ref 1.7–7.7)
NEUTROS PCT: 81 %
Platelets: 352 10*3/uL (ref 150–400)
RBC: 4.67 MIL/uL (ref 3.87–5.11)
RDW: 12.5 % (ref 11.5–15.5)
WBC: 14.9 10*3/uL — AB (ref 4.0–10.5)

## 2017-08-13 LAB — URINALYSIS, ROUTINE W REFLEX MICROSCOPIC
GLUCOSE, UA: NEGATIVE mg/dL
Ketones, ur: 80 mg/dL — AB
Nitrite: POSITIVE — AB
Specific Gravity, Urine: 1.027 (ref 1.005–1.030)
pH: 5 (ref 5.0–8.0)

## 2017-08-13 LAB — RAPID URINE DRUG SCREEN, HOSP PERFORMED
Amphetamines: POSITIVE — AB
BARBITURATES: NOT DETECTED
BENZODIAZEPINES: POSITIVE — AB
COCAINE: POSITIVE — AB
Opiates: NOT DETECTED
Tetrahydrocannabinol: POSITIVE — AB

## 2017-08-13 LAB — BASIC METABOLIC PANEL
ANION GAP: 12 (ref 5–15)
BUN: 11 mg/dL (ref 6–20)
CALCIUM: 9.5 mg/dL (ref 8.9–10.3)
CO2: 21 mmol/L — ABNORMAL LOW (ref 22–32)
Chloride: 103 mmol/L (ref 101–111)
Creatinine, Ser: 0.93 mg/dL (ref 0.44–1.00)
Glucose, Bld: 93 mg/dL (ref 65–99)
POTASSIUM: 3.4 mmol/L — AB (ref 3.5–5.1)
Sodium: 136 mmol/L (ref 135–145)

## 2017-08-13 LAB — PREGNANCY, URINE: PREG TEST UR: NEGATIVE

## 2017-08-13 LAB — ETHANOL: Alcohol, Ethyl (B): <10 mg/dL (ref <10)

## 2017-08-13 MED ORDER — LORAZEPAM 1 MG PO TABS
1.0000 mg | ORAL_TABLET | Freq: Four times a day (QID) | ORAL | Status: DC | PRN
  Administered 2017-08-13: 1 mg via ORAL
  Filled 2017-08-13: qty 1

## 2017-08-13 MED ORDER — ZOLPIDEM TARTRATE 5 MG PO TABS: 5.0000 mg | ORAL_TABLET | Freq: Every evening | ORAL | Status: DC | PRN

## 2017-08-13 MED ORDER — ADULT MULTIVITAMIN W/MINERALS CH
1.0000 | ORAL_TABLET | Freq: Every day | ORAL | Status: DC
  Administered 2017-08-13 – 2017-08-14 (×2): 1 via ORAL
  Filled 2017-08-13 (×2): qty 1

## 2017-08-13 MED ORDER — LORAZEPAM 1 MG PO TABS: 1.0000 mg | ORAL_TABLET | Freq: Two times a day (BID) | ORAL | Status: DC

## 2017-08-13 MED ORDER — ALUM & MAG HYDROXIDE-SIMETH 200-200-20 MG/5ML PO SUSP: 30.0000 mL | Freq: Four times a day (QID) | ORAL | Status: DC | PRN

## 2017-08-13 MED ORDER — LORAZEPAM 1 MG PO TABS: 1.0000 mg | ORAL_TABLET | Freq: Every day | ORAL | Status: DC

## 2017-08-13 MED ORDER — LORAZEPAM 1 MG PO TABS
1.0000 mg | ORAL_TABLET | Freq: Four times a day (QID) | ORAL | Status: DC
  Administered 2017-08-13 – 2017-08-14 (×3): 1 mg via ORAL
  Filled 2017-08-13 (×3): qty 1

## 2017-08-13 MED ORDER — ONDANSETRON HCL 4 MG PO TABS: 4.0000 mg | ORAL_TABLET | Freq: Three times a day (TID) | ORAL | Status: DC | PRN

## 2017-08-13 MED ORDER — TRAZODONE HCL 50 MG PO TABS
50.0000 mg | ORAL_TABLET | Freq: Every day | ORAL | Status: DC
  Administered 2017-08-13: 50 mg via ORAL
  Filled 2017-08-13: qty 1

## 2017-08-13 MED ORDER — VITAMIN B-1 100 MG PO TABS
100.0000 mg | ORAL_TABLET | Freq: Every day | ORAL | Status: DC
  Administered 2017-08-13 – 2017-08-14 (×2): 100 mg via ORAL
  Filled 2017-08-13 (×2): qty 1

## 2017-08-13 MED ORDER — NICOTINE 21 MG/24HR TD PT24: 21.0000 mg | MEDICATED_PATCH | Freq: Every day | TRANSDERMAL | Status: DC | PRN

## 2017-08-13 MED ORDER — LOPERAMIDE HCL 2 MG PO CAPS: 2.0000 mg | ORAL_CAPSULE | ORAL | Status: DC | PRN

## 2017-08-13 MED ORDER — HYDROXYZINE HCL 25 MG PO TABS: 25.0000 mg | ORAL_TABLET | Freq: Four times a day (QID) | ORAL | Status: DC | PRN

## 2017-08-13 MED ORDER — ONDANSETRON 4 MG PO TBDP
4.0000 mg | ORAL_TABLET | Freq: Four times a day (QID) | ORAL | Status: DC | PRN
  Administered 2017-08-14: 4 mg via ORAL
  Filled 2017-08-13: qty 1

## 2017-08-13 MED ORDER — LORAZEPAM 1 MG PO TABS: 1.0000 mg | ORAL_TABLET | Freq: Three times a day (TID) | ORAL | Status: DC

## 2017-08-13 MED ORDER — THIAMINE HCL 100 MG/ML IJ SOLN: 100.0000 mg | Freq: Once | INTRAMUSCULAR | Status: DC

## 2017-08-13 MED ORDER — ACETAMINOPHEN 325 MG PO TABS
650.0000 mg | ORAL_TABLET | ORAL | Status: DC | PRN
  Administered 2017-08-13 – 2017-08-14 (×2): 650 mg via ORAL
  Filled 2017-08-13 (×2): qty 2

## 2017-08-13 MED ORDER — NITROFURANTOIN MONOHYD MACRO 100 MG PO CAPS
100.0000 mg | ORAL_CAPSULE | Freq: Two times a day (BID) | ORAL | Status: DC
  Administered 2017-08-13 – 2017-08-14 (×2): 100 mg via ORAL
  Filled 2017-08-13 (×2): qty 1

## 2017-08-13 NOTE — ED Notes (Signed)
Patient becoming more agitated walking back and forth to phone and becoming tearful.  CIWA score obtained again to account for patient's agitation.

## 2017-08-13 NOTE — ED Notes (Signed)
wanded by security in triage.  

## 2017-08-13 NOTE — BH Assessment (Signed)
[facility] Assessment Progress Note  Case was staffed with Arville CareParks FNP who recommended patient be monitored for safety and observation. Patient will be evaluated for medication management and seen by psychiatry in the a.m.
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
## 2017-08-14 DIAGNOSIS — R4689 Other symptoms and signs involving appearance and behavior: Secondary | ICD-10-CM

## 2017-08-14 DIAGNOSIS — F332 Major depressive disorder, recurrent severe without psychotic features: Secondary | ICD-10-CM

## 2017-08-14 DIAGNOSIS — F17210 Nicotine dependence, cigarettes, uncomplicated: Secondary | ICD-10-CM

## 2017-08-14 DIAGNOSIS — F1910 Other psychoactive substance abuse, uncomplicated: Secondary | ICD-10-CM

## 2017-08-14 DIAGNOSIS — R450 Nervousness: Secondary | ICD-10-CM

## 2017-08-14 DIAGNOSIS — F419 Anxiety disorder, unspecified: Secondary | ICD-10-CM

## 2017-08-14 DIAGNOSIS — F1210 Cannabis abuse, uncomplicated: Secondary | ICD-10-CM

## 2017-08-14 DIAGNOSIS — R4587 Impulsiveness: Secondary | ICD-10-CM

## 2017-08-14 MED ORDER — NITROFURANTOIN MONOHYD MACRO 100 MG PO CAPS: 100.0000 mg | ORAL_CAPSULE | Freq: Two times a day (BID) | ORAL | 0 refills | Status: AC

## 2017-08-14 NOTE — BH Assessment (Signed)
[facility]     Per Dr Lolly MustacheArfeen, Peer support consult and refer for outpatient treatment.

## 2017-08-14 NOTE — Discharge Instructions (Signed)
Follow up at:  Follow-up Information    [facility] Follow up.   Specialty:  Behavioral Health Why:  Follow up for out-patient services  Contact information: [address] [place] [address] [phone]
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
## 2017-08-14 NOTE — Patient Outreach (Signed)
ED Peer Support Specialist Patient Intake (Complete at intake & 30-60 Day Follow-up)  Name: Kelli SermonsHarleigh D Spinney  MRN: [id number]  Age: 21 y.o.   Date of Admission: 08/14/2017  Intake: Initial Comments:      Primary Reason Admitted: poly substance use with Benzos and marijuana, depression  Lab values: Alcohol/ETOH: Negative Positive UDS? Yes Amphetamines: Yes Barbiturates: No Benzodiazepines: Yes Cocaine: Yes Opiates: No Cannabinoids: Yes  Demographic information: Gender: Female Ethnicity: White Marital Status: Single Insurance Status: Uninsured/Self-pay Control and instrumentation engineereceives non-medical governmental assistance (Work Engineer, agriculturalirst/Welfare, Sales executivefood stamps, etc.:   Lives with:   Living situation: House/Apartment  Reported Patient History: Patient reported health conditions: Depression(anxiety) Patient aware of HIV and hepatitis status:    In past year, has patient visited ED for any reason?    Number of ED visits:    Reason(s) for visit:    In past year, has patient been hospitalized for any reason?    Number of hospitalizations:    Reason(s) for hospitalization:    In past year, has patient been arrested?    Number of arrests:    Reason(s) for arrest:    In past year, has patient been incarcerated?    Number of incarcerations:    Reason(s) for incarceration:    In past year, has patient received medication-assisted treatment?    In past year, patient received the following treatments:    In past year, has patient received any harm reduction services?    Did this include any of the following?    In past year, has patient received care from a mental health provider for diagnosis other than SUD?    In past year, is this first time patient has overdosed?    Number of past overdoses:    In past year, is this first time patient has been hospitalized for an overdose?    Number of hospitalizations for overdose(s):    Is patient currently receiving treatment for a mental health diagnosis?     Patient reports experiencing difficulty participating in SUD treatment:      Most important reason(s) for this difficulty?    Has patient received prior services for treatment?    In past, patient has received services from following agencies:    Plan of Care:  Suggested follow up at these agencies/treatment centers: (Patient is not interested in any substance use recovery resources. Patient stated that she needs to get her "medications right" before she considers being sober. [facility] gave patient contact information and told her to feel free to contact [facility] at anytime.)  Other information:    Bartholomew BoardsJohn Allure Greaser, CPSS  08/14/2017 11:25 AM

## 2017-08-14 NOTE — [facility] Suicide Risk Assessment (Signed)
Suicide Risk Assessment  Discharge Assessment   [facility]   Principal Problem: MDD (major depressive disorder), recurrent severe, without psychosis ([facility]) Discharge Diagnoses:  Patient Active Problem List   Diagnosis Date Noted  . Aggressive behavior of adult [R46.89] 08/14/2017  . Polysubstance abuse ([facility]) [F19.10] 08/14/2017  . MDD (major depressive disorder), recurrent severe, without psychosis ([facility]) [F33.2] 05/12/2016    Total Time spent with patient: 30 minutes  Musculoskeletal: Strength & Muscle Tone: within normal limits Gait & Station: normal Patient leans: N/A  Psychiatric Specialty Exam: Physical Exam  Constitutional: She is oriented to person, place, and time. She appears well-developed and well-nourished.  HENT:  Head: Normocephalic.  Respiratory: Effort normal.  Musculoskeletal: Normal range of motion.  Neurological: She is alert and oriented to person, place, and time.  Psychiatric: Her speech is normal and behavior is normal. Thought content normal. Cognition and memory are normal. She expresses impulsivity. She exhibits a depressed mood.   Review of Systems  Psychiatric/Behavioral: Positive for depression and substance abuse. Negative for hallucinations, memory loss and suicidal ideas. The patient is nervous/anxious. The patient does not have insomnia.   All other systems reviewed and are negative.  Blood pressure 126/74, pulse (!) 112, temperature 98.3 F (36.8 C), temperature source Oral, resp. rate 20, last menstrual period 08/12/2017, SpO2 98 %.There is no height or weight on file to calculate BMI. General Appearance: Casual Eye Contact:  Good Speech:  Clear and Coherent and speaks in a soft voice Volume:  Decreased Mood:  Depressed Affect:  Congruent and Depressed Thought Process:  Coherent, Goal Directed and Linear Orientation:  Full (Time, Place, and Person) Thought Content:  Logical Suicidal Thoughts:  No Homicidal  Thoughts:  No Memory:  Immediate;   Good Recent;   Good Remote;   Fair Judgement:  Fair Insight:  Fair Psychomotor Activity:  Normal Concentration:  Concentration: Good and Attention Span: Good Recall:  Good Fund of Knowledge:  Good Language:  Good Akathisia:  No Handed:  Right AIMS (if indicated):    Assets:  Communication Skills Desire for Improvement Financial Resources/Insurance Housing Transportation ADL's:  Intact Cognition:  WNL   Mental Status Per Nursing Assessment::   On Admission:   Aggressive behavior while "high on several drugs."  Demographic Factors:  Adolescent or [place] adult, Caucasian, Low socioeconomic status and Unemployed  Loss Factors: Financial problems/change in socioeconomic status  Historical Factors: Impulsivity  Risk Reduction Factors:   Sense of responsibility to family and Living with another person, especially a relative  Continued Clinical Symptoms:  Depression:   Impulsivity Alcohol/Substance Abuse/Dependencies  Cognitive Features That Contribute To Risk:  Closed-mindedness    Suicide Risk:  Minimal: No identifiable suicidal ideation.  Patients presenting with no risk factors but with morbid ruminations; may be classified as minimal risk based on the severity of the depressive symptoms  Follow-up Information    [facility] Follow up.   Specialty:  Behavioral Health Why:  Follow up for out-patient services  Contact information: [address] [place] [address] [phone]           Plan Of Care/Follow-up recommendations:  Activity:  as tolerated Diet:  Heart Healthy  Laveda AbbeLaurie Britton Parks, NP 08/14/2017, 11:21 AM
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
## 2017-08-14 NOTE — ED Notes (Signed)
Patient alert, oriented and ambulatory. Patient AVS/Prescriptions/Follow-Up reviewed with her and she verbalized understanding and written copy given to patient. Patient meet with peer support prior to discharge and has no questions.Patient aware of number to call if problems. All patient belongings returned to her. Patient vitals 98.5-101/58-105-18-99% room air. Patient escorted to lobby by staff.

## 2017-08-15 LAB — GC/CHLAMYDIA PROBE AMP (~~LOC~~) NOT AT ARMC
Chlamydia: NEGATIVE
NEISSERIA GONORRHEA: NEGATIVE

## 2018-07-06 ENCOUNTER — Ambulatory Visit: Payer: Self-pay | Admitting: Physician Assistant

## 2018-07-06 ENCOUNTER — Encounter: Payer: Self-pay | Admitting: Physician Assistant

## 2018-07-06 DIAGNOSIS — F3160 Bipolar disorder, current episode mixed, unspecified: Secondary | ICD-10-CM

## 2018-07-06 DIAGNOSIS — F1910 Other psychoactive substance abuse, uncomplicated: Secondary | ICD-10-CM

## 2018-07-06 DIAGNOSIS — F411 Generalized anxiety disorder: Secondary | ICD-10-CM

## 2018-07-06 MED ORDER — QUETIAPINE FUMARATE 50 MG PO TABS: 150.0000 mg | ORAL_TABLET | Freq: Every day | ORAL | 2 refills | Status: DC

## 2018-07-06 MED ORDER — TIZANIDINE HCL 4 MG PO CAPS: 4.0000 mg | ORAL_CAPSULE | Freq: Three times a day (TID) | ORAL | 0 refills | Status: DC | PRN

## 2018-07-06 NOTE — Progress Notes (Signed)
[facility] Med Check  Patient ID: Kelli Wright,  MRN: [id number]  PCP: Patient, No Pcp Per  Date of Evaluation: 07/06/2018 Time spent:15 minutes  Chief Complaint:   HISTORY/CURRENT STATUS: HPI here for routine med check.  States her mood has been good.Patient denies loss of interest in usual activities and is able to enjoy things.  Denies decreased energy or motivation.  Appetite has not changed.  No extreme sadness, tearfulness, or feelings of hopelessness.  Denies any changes in concentration, making decisions or remembering things.  Denies suicidal or homicidal thoughts.  Patient denies increased energy with decreased need for sleep, no increased talkativeness, no racing thoughts, no impulsivity or risky behaviors, no increased spending, no increased libido, no grandiosity.  [place] and just got back a couple of weeks ago.  She had a good time there.  She denies any drug use.  Is been about 9 to 10 months since she last took any Xanax.  Individual Medical History/ Review of Systems: Changes? :No She is been having more TMJ problems bilaterally though.  The Zanaflex is not really helping as well as it did initially.  She still clenches her jaws a lot, even before the Seroquel was started this was a problem.  She has had a negative dental work-up in the past.  Past medications for mental health diagnoses include: Cymbalta, Klonopin, Xanax from the street, Celexa, Seroquel, Flexeril, Zanaflex, hydroxyzine  Allergies: Keflex [cephalexin]  Current Medications:  Current Outpatient Medications:  .  QUEtiapine (SEROQUEL) 50 MG tablet, Take 3 tablets (150 mg total) by mouth at bedtime., Disp: 90 tablet, Rfl: 2 .  tiZANidine (ZANAFLEX) 4 MG capsule, Take 1 capsule (4 mg total) by mouth 3 (three) times daily as needed for muscle spasms., Disp: 90 capsule, Rfl: 0 Medication Side Effects: none  Family Medical/ Social History: Changes?  No  MENTAL HEALTH EXAM:  There were no vitals taken for this visit.There is no height or weight on file to calculate BMI.  General Appearance: Casual  Eye Contact:  Good  Speech:  Clear and Coherent  Volume:  Normal  Mood:  Euthymic  Affect:  Appropriate  Thought Process:  Goal Directed  Orientation:  Full (Time, Place, and Person)  Thought Content: Logical   Suicidal Thoughts:  No  Homicidal Thoughts:  No  Memory:  WNL  Judgement:  Good  Insight:  Good  Psychomotor Activity:  Normal  Concentration:  Concentration: Good  Recall:  Good  Fund of Knowledge: Good  Language: Good  Assets:  Desire for Improvement  ADL's:  Intact  Cognition: WNL  Prognosis:  Good    DIAGNOSES:    ICD-10-CM   1. 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 Generalized anxiety disorder F41.1   3. Polysubstance abuse (HCC) F19.10     Receiving Psychotherapy: No    RECOMMENDATIONS: Will increase Zanaflex to 4 mg p.o. 3 times daily PRN. Continue Seroquel 50 mg 3 nightly. Recommend she see her dentist again to reevaluate the TMJ issues. Return in 4 weeks.   Melony Overlyeresa Izaiah Tabb, PA-C

## 2018-08-07 ENCOUNTER — Encounter: Payer: Self-pay | Admitting: Emergency Medicine

## 2018-08-22 ENCOUNTER — Encounter

## 2018-08-22 ENCOUNTER — Ambulatory Visit: Payer: Self-pay | Admitting: Physician Assistant

## 2018-09-01 ENCOUNTER — Telehealth: Payer: Self-pay | Admitting: Physician Assistant

## 2018-09-22 ENCOUNTER — Encounter: Payer: Self-pay | Admitting: Physician Assistant

## 2018-09-22 ENCOUNTER — Ambulatory Visit: Payer: Self-pay | Admitting: Physician Assistant

## 2018-09-22 DIAGNOSIS — F319 Bipolar disorder, unspecified: Secondary | ICD-10-CM

## 2018-09-22 DIAGNOSIS — F411 Generalized anxiety disorder: Secondary | ICD-10-CM

## 2018-09-22 DIAGNOSIS — F1910 Other psychoactive substance abuse, uncomplicated: Secondary | ICD-10-CM

## 2018-09-22 MED ORDER — GABAPENTIN 600 MG PO TABS: 600.0000 mg | ORAL_TABLET | Freq: Two times a day (BID) | ORAL | 1 refills | Status: DC

## 2018-09-22 MED ORDER — OLANZAPINE 5 MG PO TABS: 5.0000 mg | ORAL_TABLET | Freq: Every day | ORAL | 1 refills | Status: DC

## 2018-09-22 NOTE — Progress Notes (Signed)
[facility]  Patient ID: Naphtali D YMargarita Sermonsoung,  MRN: [id number]  PCP: Patient, No Pcp Per  Date of Evaluation: 09/22/2018 Time spent:15 minutes  Chief Complaint:  Chief Complaint    Depression; Manic Behavior      HISTORY/CURRENT STATUS: HPI Not doing as well.  C/o  Being on edge all the time.  Has been going on for awhile (hasn't told me b/c 'I was holding back.' We started Gabapentin on 1/10 but she states that hasn't helped.  Also having panic attacks almost every day. "I can hide it.  I just go in my room."  Has heart racing, jaw hurts, gets hot, gets mad and starts screaming and cussing.  Might last as long as 2 hours.  "I don't feel like I should be living life like this.  I've been off that shit for 2 years now and I don't crave it like I did.  But it's the only thing that helps. (Klonopin) I don't sleep.  I took a Klonopin from a friend a few times in the past month.  That's the only time I felt good."  Not working.  Can't hold a job.  It's been 6 months or more b/c states Klonopin helps her relax and stay focused.  Has no motivation to do anything.  Doesn't do anything during the day except help out her grandparents sometimes.  "I don't feel normal."  Has racing thoughts.  Denies suicidal or homicidal thoughts.  No increased spending or libido.  No grandiosity.  Jaws hurt a lot.  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  Hasn't seen dentist lately but plans to go soon.  "I am tired of this mess.  I am tired of trying things that I know are not good work.  Klonopin is what has helped the most.  And I feel like Suboxone or something would help the pain in my jaws."  Individual Medical History/ Review of Systems: Changes? :No    Past medications for mental health diagnoses include: Cymbalta, Klonopin, Xanax (street), Celexa, Vistaril, Seroquel, Zanaflex,  Risperdal  Allergies: Keflex [cephalexin]  Current Medications:  Current Outpatient Medications:  .  gabapentin (NEURONTIN) 600 MG tablet, Take 1 tablet (600 mg total) by mouth 2 (two) times daily., Disp: 60 tablet, Rfl: 1 .  OLANZapine (ZYPREXA) 5 MG tablet, Take 1 tablet (5 mg total) by mouth at bedtime., Disp: 30 tablet, Rfl: 1 Medication Side Effects: none  Family Medical/ Social History: Changes? No  MENTAL HEALTH EXAM:  There were no vitals taken for this visit.There is no height or weight on file to calculate BMI.  General Appearance: Casual, Well Groomed and acne  Eye Contact:  Good  Speech:  Clear and Coherent  Volume:  Decreased  Mood:  Angry and Anxious  Affect:  Anxious  Thought Process:  Goal Directed  Orientation:  Full (Time, Place, and Person)  Thought Content: Logical   Suicidal Thoughts:  No  Homicidal Thoughts:  No  Memory:  WNL  Judgement:  Good  Insight:  Fair  Psychomotor Activity:  Normal  Concentration:  Concentration: Good  Recall:  Good  Fund of Knowledge: Good  Language: Good  Assets:  Desire for Improvement  ADL's:  Intact  Cognition: WNL  Prognosis:  Fair    DIAGNOSES:    ICD-10-CM   1. Bipolar I disorder (HCC) F31.9  2. Generalized anxiety disorder F41.1     Receiving Psychotherapy: No    RECOMMENDATIONS: Increase gabapentin to 600 mg twice daily.   DC Seroquel. Start Zyprexa 5 mg nightly. Even though it is been a couple of years since she has been "off" the Klonopin, she took some recently from a friend and I am not going to prescribe that for her. She needs to see her dentist concerning the jaw pain. Recommend psychotherapy. Return in 6 weeks.   Melony Overly, PA-C

## 2018-10-25 ENCOUNTER — Encounter (HOSPITAL_BASED_OUTPATIENT_CLINIC_OR_DEPARTMENT_OTHER): Payer: Self-pay

## 2018-10-25 ENCOUNTER — Other Ambulatory Visit: Payer: Self-pay

## 2018-10-25 ENCOUNTER — Emergency Department (HOSPITAL_BASED_OUTPATIENT_CLINIC_OR_DEPARTMENT_OTHER)
Admission: EM | Admit: 2018-10-25 | Discharge: 2018-10-25 | Disposition: A | Discharge status: Home / Self Care | Payer: Self-pay | Attending: Emergency Medicine | Admitting: Emergency Medicine

## 2018-10-25 DIAGNOSIS — Z79899 Other long term (current) drug therapy: Secondary | ICD-10-CM | POA: Insufficient documentation

## 2018-10-25 DIAGNOSIS — N611 Abscess of the breast and nipple: Secondary | ICD-10-CM | POA: Insufficient documentation

## 2018-10-25 DIAGNOSIS — Z87891 Personal history of nicotine dependence: Secondary | ICD-10-CM | POA: Insufficient documentation

## 2018-10-25 LAB — PREGNANCY, URINE: Preg Test, Ur: NEGATIVE

## 2018-10-25 MED ORDER — SULFAMETHOXAZOLE-TRIMETHOPRIM 800-160 MG PO TABS: 1.0000 | ORAL_TABLET | Freq: Two times a day (BID) | ORAL | 0 refills | Status: AC

## 2018-10-25 MED ORDER — SULFAMETHOXAZOLE-TRIMETHOPRIM 800-160 MG PO TABS: 1.0000 | ORAL_TABLET | Freq: Two times a day (BID) | ORAL | 0 refills | Status: DC

## 2018-10-25 NOTE — ED Triage Notes (Signed)
Pt states she was sent by her PCP for left breast abscess- pt reports sx x 3 days-NAD-steady gait

## 2018-10-25 NOTE — Discharge Instructions (Addendum)
You have been diagnosed today with suspected Breast Abscess.  At this time there does not appear to be the presence of an emergent medical condition, however there is always the potential for conditions to change. Please read and follow the below instructions.  Please return to the Emergency Department immediately for any new or worsening symptoms. Please be sure to follow up with your Primary Care Provider within one week regarding your visit today; please call their office to schedule an appointment even if you are feeling better for a follow-up visit. We strongly advised that you schedule an appointment at the breast imaging center for further evaluation of your breast pain and swelling.  You must have your primary care provider contact their office to schedule this appointment.  Call your primary care doctor's office today to inform them of this.  We also suggest that you follow-up with your OB/GYN regarding your visit today. You have been prescribed Bactrim, this is an antibiotic used to treat abscesses of the breast.  Based on your presentation today  it is likely infection causing your pain and swelling.  Please be sure to follow-up with your primary care doctor, your GYN and the breast imaging center for further evaluation and definitive diagnosis.  Get help right away if you: Have severe pain. See red streaks on your skin spreading away from the abscess. More redness, swelling, or pain around your abscess. More fluid or blood coming from your abscess. Warm skin around your abscess. More pus or a bad smell coming from your abscess. A fever. Muscle aches. Chills or a general ill feeling. Any new or concerning symptoms  Please read the additional information packets attached to your discharge summary.  Do not take your medicine if  develop an itchy rash, swelling in your mouth or lips, or difficulty breathing.

## 2018-10-25 NOTE — ED Provider Notes (Signed)
[facility] EMERGENCY DEPARTMENT Provider Note   CSN: [id number] Arrival date & time: 10/25/18  1241    History   Chief Complaint Chief Complaint  Patient presents with  . Abscess    HPI Kelli Wright is a 23 y.o. female presenting today with her grandmother for left breast pain and swelling.  Patient reports that over the past 3 days she has had increasing pain and swelling to the inferior medial aspect of her left breast.  She states that she is having a moderate intensity throbbing pressure constant worsened with palpation and without alleviating factors.  Patient denies drainage from this area.  She states that the area has turned erythematous recently so past 3 days.  She was sent here from her primary care doctor's office for further evaluation.  Of note patient states that she has bilateral nipple piercings have been there for greater than 8 months she states that they "never healed right".  She had remove these piercings 2 days ago.  Patient states that she is otherwise healthy 23 year old female without immunocompromising diseases.  Patient denies fever, nausea/vomiting, abdominal pain, breast discharge, chest pain/shortness of breath or any other concerns.     HPI  Past Medical History:  Diagnosis Date  . Anxiety   . Depression   . Dislocation, shoulder closed 08/2013   left - injured in a fall  . Herpes   . Sensitive skin   . TMJ arthritis     Patient Active Problem List   Diagnosis Date Noted  . Mixed bipolar I disorder (HCC) 07/06/2018  . Aggressive behavior of adult 08/14/2017  . Polysubstance abuse (HCC) 08/14/2017  . 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 SHOULDER ARTHROSCOPY WITH BANKART REPAIR Left 11/22/2013   Procedure: LEFT SHOULDER ARTHROSCOPY WITH BANKART REPAIR/EXTENSIVE DEBRIDEMENT AND SUBACROMIAL DECOMPRESSION;  Surgeon: Sheral Apley, MD;  Location: [facility];  Service: Orthopedics;  Laterality: Left;     OB History   No obstetric history on file.      Home Medications    Prior to Admission medications   Medication Sig Start Date End Date Taking? Authorizing Provider  gabapentin (NEURONTIN) 600 MG tablet Take 1 tablet (600 mg total) by mouth 2 (two) times daily. 09/22/18   Melony Overly T, PA-C  OLANZapine (ZYPREXA) 5 MG tablet Take 1 tablet (5 mg total) by mouth at bedtime. 09/22/18   Melony Overly T, PA-C  sulfamethoxazole-trimethoprim (BACTRIM DS,SEPTRA DS) 800-160 MG tablet Take 1 tablet by mouth 2 (two) times daily for 10 days. 10/25/18 11/04/18  Harlene Salts A, PA-C  QUEtiapine (SEROQUEL) 50 MG tablet Take 3 tablets (150 mg total) by mouth at bedtime. 07/06/18 09/22/18  Cherie Ouch, PA-C    Family History Family History  Problem Relation Age of Onset  . Hypertension Paternal Grandmother     Social History Social History   Tobacco Use  . Smoking status: Former Smoker    Packs/day: 0.25    Years: 3.00    Pack years: 0.75    Types: Cigarettes  . Smokeless tobacco: Never Used  Substance Use Topics  . Alcohol use: No    Comment:     . Drug use: Yes    Types: Benzodiazepines, Marijuana     Allergies   Keflex [cephalexin]   Review of Systems Review of Systems  Constitutional: Negative for chills and fever.  Respiratory: Negative.  Negative for cough and shortness of breath.   Cardiovascular: Negative.  Negative for chest pain.  Gastrointestinal: Negative.  Negative for abdominal pain, nausea and vomiting.  Musculoskeletal: Negative.  Negative for arthralgias and myalgias.  Skin: Positive for color change (Pain and swelling). Negative for wound.  Neurological: Negative.  Negative for weakness, numbness and headaches.  All other systems reviewed and are negative.  Physical Exam Updated Vital Signs BP 101/64 (BP Location: Left Arm)   Pulse 83   Temp 98.1 F (36.7  C) (Oral)   Resp 12   Ht 5\' 1"  (1.549 m)   Wt 53.5 kg   LMP 10/04/2018 (Approximate)   SpO2 100%   BMI 22.30 kg/m   Physical Exam Exam conducted with a chaperone present.  Constitutional:      General: She is not in acute distress.    Appearance: Normal appearance. She is well-developed. She is not ill-appearing or diaphoretic.  HENT:     Head: Normocephalic and atraumatic.     Right Ear: External ear normal.     Left Ear: External ear normal.     Nose: Nose normal.  Eyes:     General: Vision grossly intact. Gaze aligned appropriately.     Pupils: Pupils are equal, round, and reactive to light.  Neck:     Musculoskeletal: Normal range of motion.     Trachea: Trachea and phonation normal. No tracheal deviation.  Pulmonary:     Effort: Pulmonary effort is normal. No respiratory distress.  Chest:     Breasts:        Right: Normal.        Left: Swelling, skin change and tenderness present. No bleeding, inverted nipple or nipple discharge.       Comments: Examination chaperoned by Earlie Counts and patient's grandmother.  Blanchable erythema area of the left inferior medial breast tissue extending up to the area Ola.  Area is indurated without fluctuance.  No drainage present.  When palpated no drainage expressed from the nipple.  Area is moderately tender to palpation. Abdominal:     General: There is no distension.     Palpations: Abdomen is soft.     Tenderness: There is no abdominal tenderness. There is no guarding or rebound.  Musculoskeletal: Normal range of motion.  Skin:    General: Skin is warm and dry.  Neurological:     Mental Status: She is alert.     GCS: GCS eye subscore is 4. GCS verbal subscore is 5. GCS motor subscore is 6.     Comments: Speech is clear and goal oriented, follows commands Major Cranial nerves without deficit, no facial droop Moves extremities without ataxia, coordination intact  Psychiatric:        Behavior: Behavior normal.      ED  Treatments / Results  Labs (all labs ordered are listed, but only abnormal results are displayed) Labs Reviewed  PREGNANCY, URINE    EKG None  Radiology No results found.  Procedures Procedures (including critical care time)  Medications Ordered in ED Medications - No data to display   Initial Impression / Assessment and Plan / ED Course  I have reviewed the triage vital signs and the nursing notes.  Pertinent labs & imaging results that were available during my care of the patient were reviewed by me and considered in my medical decision making (see chart for details).    23 year old female without history of known immunocompromising diseases presents today for left breast pain  and color change.  Patient with 3-day history of pain, swelling and erythema of the left inferomedial breast tissue.  No active drainage, no expression of drainage from the nipple.  Patient without systemic symptoms, denies fever.  On arrival she is overall well-appearing and in no acute distress.  Afebrile, not tachycardic, not hypotensive.  Physical examination today shows induration, erythema consistent with breast abscess.  Urine pregnancy test negative.  Patient has been given referral to breast imaging center for further evaluation of this area.  We will treat today for presumed infection, Bactrim twice daily x10 days.  Patient informed that she must follow-up with the breast imaging center and her OB/GYN regarding condition today.  Patient informed that further evaluation and imaging will be needed to establish a diagnosis.  Patient states understanding.  Patient denies history of CKD, she states understanding to take Bactrim as prescribed and need for follow-up.  At this time there does not appear to be any evidence of an acute emergency medical condition and the patient appears stable for discharge with appropriate outpatient follow up. Diagnosis was discussed with patient who verbalizes understanding  of care plan and is agreeable to discharge. I have discussed return precautions with patient and grandmother who verbalize understanding of return precautions. Patient strongly encouraged to follow-up with their PCP and OBGYN. All questions answered.  Patient's case discussed with Dr. Dalene Seltzer who agrees with plan to discharge with Bactrim and follow-up.   Note: Portions of this report may have been transcribed using voice recognition software. Every effort was made to ensure accuracy; however, inadvertent computerized transcription errors may still be present. Final Clinical Impressions(s) / ED Diagnoses   Final diagnoses:  Breast abscess    ED Discharge Orders         Ordered    sulfamethoxazole-trimethoprim (BACTRIM DS,SEPTRA DS) 800-160 MG tablet  2 times daily,   Status:  Discontinued     10/25/18 1623    sulfamethoxazole-trimethoprim (BACTRIM DS,SEPTRA DS) 800-160 MG tablet  2 times daily     10/25/18 1625           Elizabeth Palau 10/25/18 1650    Alvira Monday, MD 10/27/18 1503

## 2018-10-25 NOTE — ED Notes (Signed)
Pt enrolled in aromatherapy pain trial 

## 2018-10-26 ENCOUNTER — Other Ambulatory Visit: Payer: Self-pay

## 2018-10-26 ENCOUNTER — Other Ambulatory Visit: Payer: Self-pay | Admitting: Family Medicine

## 2018-10-26 ENCOUNTER — Emergency Department (HOSPITAL_COMMUNITY)
Admission: EM | Admit: 2018-10-26 | Discharge: 2018-10-26 | Discharge status: Absent without leave | Payer: Self-pay | Attending: Emergency Medicine | Admitting: Emergency Medicine

## 2018-10-26 ENCOUNTER — Encounter (HOSPITAL_COMMUNITY): Payer: Self-pay | Admitting: *Deleted

## 2018-10-26 ENCOUNTER — Ambulatory Visit
Admission: RE | Admit: 2018-10-26 | Discharge: 2018-10-26 | Disposition: A | Discharge status: Home / Self Care | Payer: No Typology Code available for payment source | Source: Ambulatory Visit | Attending: Family Medicine | Admitting: Family Medicine

## 2018-10-26 DIAGNOSIS — N644 Mastodynia: Secondary | ICD-10-CM

## 2018-10-26 DIAGNOSIS — N611 Abscess of the breast and nipple: Secondary | ICD-10-CM

## 2018-10-26 NOTE — ED Triage Notes (Signed)
Pt complains of left breast abscess x 4 days. Pt went to [facility] and put on Bactrim but states breast has swollen larger. PCP recommended pt be evaluated in ED. Pt has taken 2 doses of antibiotic.

## 2018-11-02 ENCOUNTER — Ambulatory Visit
Admission: RE | Admit: 2018-11-02 | Discharge: 2018-11-02 | Disposition: A | Discharge status: Home / Self Care | Payer: No Typology Code available for payment source | Source: Ambulatory Visit | Attending: Family Medicine | Admitting: Family Medicine

## 2018-11-02 ENCOUNTER — Other Ambulatory Visit (HOSPITAL_COMMUNITY)
Admission: RE | Admit: 2018-11-02 | Discharge: 2018-11-02 | Disposition: A | Discharge status: Home / Self Care | Payer: No Typology Code available for payment source | Source: Ambulatory Visit | Attending: Radiology | Admitting: Radiology

## 2018-11-02 ENCOUNTER — Other Ambulatory Visit: Payer: Self-pay

## 2018-11-02 ENCOUNTER — Other Ambulatory Visit: Payer: Self-pay | Admitting: Family Medicine

## 2018-11-02 DIAGNOSIS — N611 Abscess of the breast and nipple: Secondary | ICD-10-CM

## 2018-11-02 DIAGNOSIS — N644 Mastodynia: Secondary | ICD-10-CM

## 2018-11-03 ENCOUNTER — Ambulatory Visit: Payer: Self-pay | Admitting: Physician Assistant

## 2018-11-07 ENCOUNTER — Other Ambulatory Visit: Payer: Self-pay | Admitting: General Surgery

## 2018-11-07 ENCOUNTER — Other Ambulatory Visit: Payer: Self-pay

## 2018-11-07 ENCOUNTER — Ambulatory Visit
Admission: RE | Admit: 2018-11-07 | Discharge: 2018-11-07 | Disposition: A | Discharge status: Home / Self Care | Payer: No Typology Code available for payment source | Source: Ambulatory Visit | Attending: Family Medicine | Admitting: Family Medicine

## 2018-11-07 DIAGNOSIS — N611 Abscess of the breast and nipple: Secondary | ICD-10-CM

## 2018-11-07 DIAGNOSIS — N644 Mastodynia: Secondary | ICD-10-CM

## 2018-11-07 LAB — AEROBIC/ANAEROBIC CULTURE W GRAM STAIN (SURGICAL/DEEP WOUND)

## 2018-11-08 ENCOUNTER — Other Ambulatory Visit: Payer: Self-pay

## 2018-11-08 ENCOUNTER — Encounter (HOSPITAL_BASED_OUTPATIENT_CLINIC_OR_DEPARTMENT_OTHER): Payer: Self-pay | Admitting: *Deleted

## 2018-11-08 NOTE — Anesthesia Preprocedure Evaluation (Addendum)
Anesthesia Evaluation  Patient identified by MRN, date of birth, ID band Patient awake    Reviewed: Allergy & Precautions, NPO status , Patient's Chart, lab work & pertinent test results  Airway Mallampati: II  TM Distance: >3 FB Neck ROM: Full    Dental no notable dental hx.    Pulmonary former smoker,    Pulmonary exam normal breath sounds clear to auscultation       Cardiovascular negative cardio ROS Normal cardiovascular exam Rhythm:Regular Rate:Normal     Neuro/Psych PSYCHIATRIC DISORDERS Anxiety Depression Bipolar Disorder negative neurological ROS     GI/Hepatic negative GI ROS, (+)     substance abuse  ,   Endo/Other  negative endocrine ROS  Renal/GU negative Renal ROS     Musculoskeletal negative musculoskeletal ROS (+)   Abdominal   Peds  Hematology negative hematology ROS (+)   Anesthesia Other Findings LEFT BREAST MASS  Reproductive/Obstetrics hcg negative                            Anesthesia Physical Anesthesia Plan  ASA: II  Anesthesia Plan: General   Post-op Pain Management:    Induction: Intravenous  PONV Risk Score and Plan: 3 and Midazolam, Dexamethasone, Ondansetron and Treatment may vary due to age or medical condition  Airway Management Planned: LMA  Additional Equipment:   Intra-op Plan:   Post-operative Plan: Extubation in OR  Informed Consent: I have reviewed the patients History and Physical, chart, labs and discussed the procedure including the risks, benefits and alternatives for the proposed anesthesia with the patient or authorized representative who has indicated his/her understanding and acceptance.     Dental advisory given  Plan Discussed with: CRNA  Anesthesia Plan Comments:        Anesthesia Quick Evaluation

## 2018-11-08 NOTE — H&P (Signed)
Kelli Wright  Location: [facility] Patient #: [id number] DOB: July 05, 1996 Separated / Language: [place] / Race: White Female   History of Present Illness  The patient is a 23 year old female who presents with a breast abscess. This is a 23 year old female. She is accompanied by her grandmother throughout the encounter. She is referred by Dr. Gerome Sam at the [facility] for a left breast abscess that has not responded to standard antibiotic therapy and aspiration. This is an MRSA abscess.  The patient had a nipple ring. That has been removed. She smokes. She was referred to the breast center by [facility] on October 26, 2018 having just been started on Bactrim. They found cellulitis with edema in the left breast parenchyma medially but no drainable abscess. When she came back on March 12 they found numerous abscesses some of which may be interconnected. They switched her to doxycycline. They aspirated the abscess. It is MRSA. I saw her again today and said that we needed to see her because it was not responding. They were correct.  Past history reveals daily tobacco use. Uses marijuana and benzodiazepines. TMJ surgery. Depression and anxiety. No major surgery or medical problems Social history reveals she is single. 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 Smokes daily. Denies alcohol. Smokes marijuana.  Exam reveals a huge unifocal abscess in the medial left breast. At least 8 cm in size. Not actively draining but very tender and red. This will need to be managed under general anesthesia in the operating room. She has enough doxycycline to last 3 days. We are going to schedule her for ultrasound-guided drainage and debridement of her left breast abscess in the OR I discussed the indications, details, techniques, numerous risk of the surgery with her and her grandmother. She is aware of the risk of  bleeding, recurrent infection, cosmetic deformity and scar. I described the scar as a radial incision or lips. She understands all of these issues. All her questions were answered. She agrees with this plan. She is tearful and anxious, however I did strongly encourage her to discontinue smoking    Allergies Keflex *CEPHALOSPORINS*  Allergies Reconciled    Doxycycline Hyclate (20MG  Tablet, Oral) Active. Medications Reconciled  Vitals ( Weight: 125.5 lb Height: 61in Body Surface Area: 1.55 m Body Mass Index: 23.71 kg/m  Temp.: 98.11F(Oral)  Pulse: 94 (Regular)  P.OX: 97% (Room air) BP: 110/62 (Sitting, Left Arm, Standard)       Physical Exam  General Mental Status-Alert. General Appearance-Consistent with stated age. Hydration-Well hydrated. Voice-Normal.  Head and Neck Head-normocephalic, atraumatic with no lesions or palpable masses. Trachea-midline. Thyroid Gland Characteristics - normal size and consistency.  Eye Eyeball - Bilateral-Extraocular movements intact. Sclera/Conjunctiva - Bilateral-No scleral icterus.  Chest and Lung Exam Chest and lung exam reveals -quiet, even and easy respiratory effort with no use of accessory muscles and on auscultation, normal breath sounds, no adventitious sounds and normal vocal resonance. Inspection Chest Wall - Normal. Back - normal.  Breast Note: Breasts are medium size. There is an 8 cm erythematous tender mass in the medial left breast at the 9 o'clock position. No other mass or skin changes elsewhere in either breast. No axillary adenopathy.   Cardiovascular Cardiovascular examination reveals -normal heart sounds, regular rate and rhythm with no murmurs and normal pedal pulses bilaterally.  Abdomen Inspection Inspection of the abdomen reveals - No Hernias. Skin - Scar - no  surgical scars. Palpation/Percussion Palpation and Percussion of the abdomen reveal - Soft, Non  Tender, No Rebound tenderness, No Rigidity (guarding) and No hepatosplenomegaly. Auscultation Auscultation of the abdomen reveals - Bowel sounds normal.  Neurologic Neurologic evaluation reveals -alert and oriented x 3 with no impairment of recent or remote memory. Mental Status-Normal.  Neuropsychiatric Note: Alert. Oriented 4. Tearful and anxious. Good insight.   Musculoskeletal Normal Exam - Left-Upper Extremity Strength Normal and Lower Extremity Strength Normal. Normal Exam - Right-Upper Extremity Strength Normal and Lower Extremity Strength Normal.  Lymphatic Head & Neck  General Head & Neck Lymphatics: Bilateral - Description - Normal. Axillary  General Axillary Region: Bilateral - Description - Normal. Tenderness - Non Tender. Femoral & Inguinal  Generalized Femoral & Inguinal Lymphatics: Bilateral - Description - Normal. Tenderness - Non Tender.    Assessment & Plan  LEFT BREAST ABSCESS (N61.1) Current Plans Schedule for Surgery  You have a abscess of the left breast that has not responded to 2 week course of antibiotics Needle aspirate reveals this is an MRSA abscess This will need to be drained and debrided in the operating room with the incision that I had described for you and your grandmother We will need to do the surgery this week We have discussed the indications, techniques, and risk of the surgery in detail  We will call in a prescription strength pain medicine for you today Continue to take the doxycycline twice a day and I will give you another prescription after the surgery  MRSA CELLULITIS (L03.90)    Angelia Mould. Derrell Lolling, M.D., [facility] General and Minimally invasive Surgery Breast and Colorectal Surgery Office:   [phone] Pager:   [phone]

## 2018-11-09 ENCOUNTER — Encounter (HOSPITAL_BASED_OUTPATIENT_CLINIC_OR_DEPARTMENT_OTHER)
Admission: RE | Disposition: A | Discharge status: Home / Self Care | Payer: Self-pay | Source: Home / Self Care | Attending: General Surgery

## 2018-11-09 ENCOUNTER — Other Ambulatory Visit: Payer: Self-pay

## 2018-11-09 ENCOUNTER — Ambulatory Visit (HOSPITAL_BASED_OUTPATIENT_CLINIC_OR_DEPARTMENT_OTHER)
Admission: RE | Admit: 2018-11-09 | Discharge: 2018-11-09 | Disposition: A | Discharge status: Home / Self Care | Payer: Self-pay | Attending: General Surgery | Admitting: General Surgery

## 2018-11-09 ENCOUNTER — Ambulatory Visit (HOSPITAL_BASED_OUTPATIENT_CLINIC_OR_DEPARTMENT_OTHER): Payer: Self-pay | Admitting: Anesthesiology

## 2018-11-09 ENCOUNTER — Encounter (HOSPITAL_BASED_OUTPATIENT_CLINIC_OR_DEPARTMENT_OTHER): Payer: Self-pay

## 2018-11-09 DIAGNOSIS — B9562 Methicillin resistant Staphylococcus aureus infection as the cause of diseases classified elsewhere: Secondary | ICD-10-CM | POA: Insufficient documentation

## 2018-11-09 DIAGNOSIS — N611 Abscess of the breast and nipple: Secondary | ICD-10-CM | POA: Diagnosis present

## 2018-11-09 DIAGNOSIS — F419 Anxiety disorder, unspecified: Secondary | ICD-10-CM | POA: Insufficient documentation

## 2018-11-09 DIAGNOSIS — Z79899 Other long term (current) drug therapy: Secondary | ICD-10-CM | POA: Insufficient documentation

## 2018-11-09 DIAGNOSIS — F319 Bipolar disorder, unspecified: Secondary | ICD-10-CM | POA: Insufficient documentation

## 2018-11-09 HISTORY — DX: Other psychoactive substance dependence, uncomplicated: F19.20

## 2018-11-09 HISTORY — DX: Abscess of the breast and nipple: N61.1

## 2018-11-09 HISTORY — PX: INCISION AND DRAINAGE ABSCESS: SHX5864

## 2018-11-09 LAB — POCT PREGNANCY, URINE: Preg Test, Ur: NEGATIVE

## 2018-11-09 SURGERY — INCISION AND DRAINAGE, ABSCESS
Anesthesia: General | Site: Breast | Laterality: Left

## 2018-11-09 MED ORDER — OXYCODONE HCL 5 MG PO TABS
ORAL_TABLET | ORAL | Status: AC
  Filled 2018-11-09: qty 1

## 2018-11-09 MED ORDER — ACETAMINOPHEN 500 MG PO TABS: 1000.0000 mg | ORAL_TABLET | Freq: Four times a day (QID) | ORAL | Status: DC

## 2018-11-09 MED ORDER — DEXAMETHASONE SODIUM PHOSPHATE 4 MG/ML IJ SOLN
INTRAMUSCULAR | Status: DC | PRN
  Administered 2018-11-09: 10 mg via INTRAVENOUS

## 2018-11-09 MED ORDER — FENTANYL CITRATE (PF) 100 MCG/2ML IJ SOLN
50.0000 ug | INTRAMUSCULAR | Status: DC | PRN
  Administered 2018-11-09: 100 ug via INTRAVENOUS

## 2018-11-09 MED ORDER — FENTANYL CITRATE (PF) 100 MCG/2ML IJ SOLN: 25.0000 ug | INTRAMUSCULAR | Status: DC | PRN

## 2018-11-09 MED ORDER — VANCOMYCIN HCL 10 G IV SOLR
1500.0000 mg | INTRAVENOUS | Status: AC
  Administered 2018-11-09: 1500 mg via INTRAVENOUS

## 2018-11-09 MED ORDER — LACTATED RINGERS IV SOLN
INTRAVENOUS | Status: DC
  Administered 2018-11-09: 07:00:00 via INTRAVENOUS

## 2018-11-09 MED ORDER — ONDANSETRON HCL 4 MG/2ML IJ SOLN
INTRAMUSCULAR | Status: DC | PRN
  Administered 2018-11-09: 4 mg via INTRAVENOUS

## 2018-11-09 MED ORDER — MIDAZOLAM HCL 2 MG/2ML IJ SOLN
INTRAMUSCULAR | Status: AC
  Filled 2018-11-09: qty 2

## 2018-11-09 MED ORDER — VANCOMYCIN HCL IN DEXTROSE 500-5 MG/100ML-% IV SOLN
INTRAVENOUS | Status: AC
  Filled 2018-11-09: qty 100

## 2018-11-09 MED ORDER — ACETAMINOPHEN 325 MG PO TABS: 650.0000 mg | ORAL_TABLET | ORAL | Status: DC | PRN

## 2018-11-09 MED ORDER — PROPOFOL 10 MG/ML IV BOLUS
INTRAVENOUS | Status: DC | PRN
  Administered 2018-11-09: 200 mg via INTRAVENOUS

## 2018-11-09 MED ORDER — CHLORHEXIDINE GLUCONATE CLOTH 2 % EX PADS: 6.0000 | MEDICATED_PAD | Freq: Once | CUTANEOUS | Status: DC

## 2018-11-09 MED ORDER — OXYCODONE HCL 5 MG/5ML PO SOLN: 5.0000 mg | Freq: Once | ORAL | Status: AC | PRN

## 2018-11-09 MED ORDER — ONDANSETRON HCL 4 MG/2ML IJ SOLN
INTRAMUSCULAR | Status: AC
  Filled 2018-11-09: qty 2

## 2018-11-09 MED ORDER — FENTANYL CITRATE (PF) 100 MCG/2ML IJ SOLN
INTRAMUSCULAR | Status: AC
  Filled 2018-11-09: qty 2

## 2018-11-09 MED ORDER — GABAPENTIN 300 MG PO CAPS
ORAL_CAPSULE | ORAL | Status: AC
  Filled 2018-11-09: qty 1

## 2018-11-09 MED ORDER — HYDROMORPHONE HCL 1 MG/ML IJ SOLN: 0.2500 mg | INTRAMUSCULAR | Status: DC | PRN

## 2018-11-09 MED ORDER — VANCOMYCIN HCL IN DEXTROSE 1-5 GM/200ML-% IV SOLN
INTRAVENOUS | Status: AC
  Filled 2018-11-09: qty 200

## 2018-11-09 MED ORDER — SCOPOLAMINE 1 MG/3DAYS TD PT72: 1.0000 | MEDICATED_PATCH | Freq: Once | TRANSDERMAL | Status: DC | PRN

## 2018-11-09 MED ORDER — MIDAZOLAM HCL 2 MG/2ML IJ SOLN
1.0000 mg | INTRAMUSCULAR | Status: DC | PRN
  Administered 2018-11-09: 2 mg via INTRAVENOUS

## 2018-11-09 MED ORDER — LIDOCAINE 2% (20 MG/ML) 5 ML SYRINGE
INTRAMUSCULAR | Status: AC
  Filled 2018-11-09: qty 5

## 2018-11-09 MED ORDER — CELECOXIB 200 MG PO CAPS
200.0000 mg | ORAL_CAPSULE | ORAL | Status: AC
  Administered 2018-11-09: 200 mg via ORAL

## 2018-11-09 MED ORDER — BUPIVACAINE HCL (PF) 0.25 % IJ SOLN
INTRAMUSCULAR | Status: AC
  Filled 2018-11-09: qty 30

## 2018-11-09 MED ORDER — BUPIVACAINE HCL (PF) 0.5 % IJ SOLN
INTRAMUSCULAR | Status: AC
  Filled 2018-11-09: qty 60

## 2018-11-09 MED ORDER — OXYCODONE HCL 5 MG PO TABS: 5.0000 mg | ORAL_TABLET | ORAL | Status: DC | PRN

## 2018-11-09 MED ORDER — PROMETHAZINE HCL 25 MG/ML IJ SOLN: 6.2500 mg | INTRAMUSCULAR | Status: DC | PRN

## 2018-11-09 MED ORDER — CELECOXIB 200 MG PO CAPS
ORAL_CAPSULE | ORAL | Status: AC
  Filled 2018-11-09: qty 1

## 2018-11-09 MED ORDER — LIDOCAINE 2% (20 MG/ML) 5 ML SYRINGE
INTRAMUSCULAR | Status: DC | PRN
  Administered 2018-11-09: 40 mg via INTRAVENOUS

## 2018-11-09 MED ORDER — MUPIROCIN 2 % EX OINT: TOPICAL_OINTMENT | CUTANEOUS | 1 refills | Status: AC

## 2018-11-09 MED ORDER — PROPOFOL 500 MG/50ML IV EMUL
INTRAVENOUS | Status: AC
  Filled 2018-11-09: qty 50

## 2018-11-09 MED ORDER — OXYCODONE HCL 5 MG PO TABS
5.0000 mg | ORAL_TABLET | Freq: Once | ORAL | Status: AC | PRN
  Administered 2018-11-09: 5 mg via ORAL

## 2018-11-09 MED ORDER — SODIUM CHLORIDE 0.9 % IV SOLN: 250.0000 mL | INTRAVENOUS | Status: DC | PRN

## 2018-11-09 MED ORDER — BUPIVACAINE-EPINEPHRINE (PF) 0.25% -1:200000 IJ SOLN
INTRAMUSCULAR | Status: AC
  Filled 2018-11-09: qty 60

## 2018-11-09 MED ORDER — SODIUM CHLORIDE 0.9% FLUSH: 3.0000 mL | INTRAVENOUS | Status: DC | PRN

## 2018-11-09 MED ORDER — ACETAMINOPHEN 500 MG PO TABS
ORAL_TABLET | ORAL | Status: AC
  Filled 2018-11-09: qty 2

## 2018-11-09 MED ORDER — LACTATED RINGERS IV SOLN: INTRAVENOUS | Status: DC

## 2018-11-09 MED ORDER — GABAPENTIN 300 MG PO CAPS
300.0000 mg | ORAL_CAPSULE | ORAL | Status: AC
  Administered 2018-11-09: 300 mg via ORAL

## 2018-11-09 MED ORDER — SODIUM CHLORIDE 0.9% FLUSH: 3.0000 mL | Freq: Two times a day (BID) | INTRAVENOUS | Status: DC

## 2018-11-09 MED ORDER — ACETAMINOPHEN 650 MG RE SUPP: 650.0000 mg | RECTAL | Status: DC | PRN

## 2018-11-09 MED ORDER — DEXAMETHASONE SODIUM PHOSPHATE 10 MG/ML IJ SOLN
INTRAMUSCULAR | Status: AC
  Filled 2018-11-09: qty 1

## 2018-11-09 MED ORDER — ACETAMINOPHEN 500 MG PO TABS
1000.0000 mg | ORAL_TABLET | ORAL | Status: AC
  Administered 2018-11-09: 1000 mg via ORAL

## 2018-11-09 MED ORDER — DOXYCYCLINE HYCLATE 100 MG PO TABS: 100.0000 mg | ORAL_TABLET | Freq: Two times a day (BID) | ORAL | 2 refills | Status: AC

## 2018-11-09 SURGICAL SUPPLY — 58 items
APPLIER CLIP 9.375 MED OPEN (MISCELLANEOUS)
BINDER BREAST MEDIUM (GAUZE/BANDAGES/DRESSINGS) ×4 IMPLANT
BLADE HEX COATED 2.75 (ELECTRODE) ×4 IMPLANT
BLADE SURG 15 STRL LF DISP TIS (BLADE) ×2 IMPLANT
BLADE SURG 15 STRL SS (BLADE) ×2
CANISTER SUCT 1200ML W/VALVE (MISCELLANEOUS) ×4 IMPLANT
CHLORAPREP W/TINT 26 (MISCELLANEOUS) ×4 IMPLANT
CLIP APPLIE 9.375 MED OPEN (MISCELLANEOUS) IMPLANT
COVER BACK TABLE 60X90IN (DRAPES) ×4 IMPLANT
COVER MAYO STAND STRL (DRAPES) ×4 IMPLANT
COVER WAND RF STERILE (DRAPES) IMPLANT
DECANTER SPIKE VIAL GLASS SM (MISCELLANEOUS) IMPLANT
DERMABOND ADVANCED (GAUZE/BANDAGES/DRESSINGS)
DERMABOND ADVANCED .7 DNX12 (GAUZE/BANDAGES/DRESSINGS) IMPLANT
DRAPE LAPAROSCOPIC ABDOMINAL (DRAPES) ×4 IMPLANT
DRAPE LAPAROTOMY 100X72 PEDS (DRAPES) IMPLANT
DRAPE UTILITY XL STRL (DRAPES) ×4 IMPLANT
DRSG PAD ABDOMINAL 8X10 ST (GAUZE/BANDAGES/DRESSINGS) ×4 IMPLANT
ELECT REM PT RETURN 9FT ADLT (ELECTROSURGICAL) ×4
ELECTRODE REM PT RTRN 9FT ADLT (ELECTROSURGICAL) ×2 IMPLANT
GAUZE 4X4 16PLY RFD (DISPOSABLE) IMPLANT
GAUZE PACKING IODOFORM 1/2 (PACKING) ×4 IMPLANT
GAUZE SPONGE 4X4 12PLY STRL (GAUZE/BANDAGES/DRESSINGS) ×4 IMPLANT
GAUZE SPONGE 4X4 12PLY STRL LF (GAUZE/BANDAGES/DRESSINGS) IMPLANT
GLOVE BIO SURGEON STRL SZ 6.5 (GLOVE) ×3 IMPLANT
GLOVE BIO SURGEONS STRL SZ 6.5 (GLOVE) ×1
GLOVE EUDERMIC 7 POWDERFREE (GLOVE) ×4 IMPLANT
GLOVE EXAM NITRILE MD LF STRL (GLOVE) ×4 IMPLANT
GOWN STRL REUS W/ TWL LRG LVL3 (GOWN DISPOSABLE) ×2 IMPLANT
GOWN STRL REUS W/ TWL XL LVL3 (GOWN DISPOSABLE) ×2 IMPLANT
GOWN STRL REUS W/TWL LRG LVL3 (GOWN DISPOSABLE) ×2
GOWN STRL REUS W/TWL XL LVL3 (GOWN DISPOSABLE) ×2
ILLUMINATOR WAVEGUIDE N/F (MISCELLANEOUS) IMPLANT
KIT MARKER MARGIN INK (KITS) IMPLANT
LIGHT WAVEGUIDE WIDE FLAT (MISCELLANEOUS) IMPLANT
NEEDLE 16GX1 1/2 (NEEDLE) ×4 IMPLANT
NEEDLE HYPO 25X1 1.5 SAFETY (NEEDLE) IMPLANT
NS IRRIG 1000ML POUR BTL (IV SOLUTION) ×4 IMPLANT
PACK BASIN DAY SURGERY FS (CUSTOM PROCEDURE TRAY) ×4 IMPLANT
PENCIL BUTTON HOLSTER BLD 10FT (ELECTRODE) ×4 IMPLANT
SLEEVE SCD COMPRESS KNEE MED (MISCELLANEOUS) ×4 IMPLANT
SPONGE LAP 4X18 RFD (DISPOSABLE) ×4 IMPLANT
SUT MNCRL AB 4-0 PS2 18 (SUTURE) IMPLANT
SUT SILK 2 0 SH (SUTURE) IMPLANT
SUT VIC AB 2-0 SH 27 (SUTURE)
SUT VIC AB 2-0 SH 27XBRD (SUTURE) IMPLANT
SUT VICRYL 3-0 CR8 SH (SUTURE) IMPLANT
SUT VICRYL 4-0 PS2 18IN ABS (SUTURE) IMPLANT
SWAB COLLECTION DEVICE MRSA (MISCELLANEOUS) ×4 IMPLANT
SWAB CULTURE ESWAB REG 1ML (MISCELLANEOUS) ×4 IMPLANT
SYR 10ML LL (SYRINGE) ×4 IMPLANT
SYR BULB 3OZ (MISCELLANEOUS) ×4 IMPLANT
TAPE HYPAFIX 4 X10 (GAUZE/BANDAGES/DRESSINGS) IMPLANT
TOWEL GREEN STERILE FF (TOWEL DISPOSABLE) ×4 IMPLANT
TRAY FAXITRON CT DISP (TRAY / TRAY PROCEDURE) IMPLANT
TUBE CONNECTING 20'X1/4 (TUBING) ×1
TUBE CONNECTING 20X1/4 (TUBING) ×3 IMPLANT
YANKAUER SUCT BULB TIP NO VENT (SUCTIONS) ×4 IMPLANT

## 2018-11-09 NOTE — Anesthesia Postprocedure Evaluation (Signed)
Anesthesia Post Note  Patient: Kelli Wright  Procedure(s) Performed: INCISION AND DRAINAGE LEFT BREAST ABSCESS (Left Breast)     Patient location during evaluation: PACU Anesthesia Type: General Level of consciousness: awake and alert Pain management: pain level controlled Vital Signs Assessment: post-procedure vital signs reviewed and stable Respiratory status: spontaneous breathing, nonlabored ventilation, respiratory function stable and patient connected to nasal cannula oxygen Cardiovascular status: blood pressure returned to baseline and stable Postop Assessment: no apparent nausea or vomiting Anesthetic complications: no    Last Vitals:  Vitals:   11/09/18 0930 11/09/18 1015  BP: 114/81 112/83  Pulse: 67 68  Resp: (!) 22 20  Temp:  36.7 C  SpO2: 98% 100%    Last Pain:  Vitals:   11/09/18 1015  TempSrc:   PainSc: 3                  Ryan P Ellender

## 2018-11-09 NOTE — Transfer of Care (Signed)
Immediate Anesthesia Transfer of Care Note  Patient: Kelli Wright  Procedure(s) Performed: DRAINAGE AND DEBRIDEMENT MRSA OF LEFT BREAST ABSCESS (MASS) WITH ULTRASOUND (Left )  Patient Location: PACU  Anesthesia Type:General  Level of Consciousness: awake and sedated  Airway & Oxygen Therapy: Patient Spontanous Breathing and Patient connected to face mask oxygen  Post-op Assessment: Report given to RN and Post -op Vital signs reviewed and stable  Post vital signs: Reviewed and stable  Last Vitals:  Vitals Value Taken Time  BP 91/61 11/09/2018  8:03 AM  Temp    Pulse 58 11/09/2018  8:07 AM  Resp 11 11/09/2018  8:07 AM  SpO2 100 % 11/09/2018  8:07 AM  Vitals shown include unvalidated device data.  Last Pain:  Vitals:   11/09/18 0637  TempSrc: Oral  PainSc: 7          Complications: No apparent anesthesia complications

## 2018-11-09 NOTE — Anesthesia Procedure Notes (Signed)
Procedure Name: LMA Insertion Performed by: Karlea Mckibbin W, CRNA Pre-anesthesia Checklist: Patient identified, Emergency Drugs available, Suction available and Patient being monitored Patient Re-evaluated:Patient Re-evaluated prior to induction Oxygen Delivery Method: Circle system utilized Preoxygenation: Pre-oxygenation with 100% oxygen Induction Type: IV induction Ventilation: Mask ventilation without difficulty LMA: LMA inserted LMA Size: 4.0 Number of attempts: 1 Placement Confirmation: positive ETCO2 Tube secured with: Tape Dental Injury: Teeth and Oropharynx as per pre-operative assessment        

## 2018-11-09 NOTE — Interval H&P Note (Signed)
History and Physical Interval Note:  11/09/2018 7:01 AM  Kelli Wright  has presented today for surgery, with the diagnosis of LEFT BREAST MASS.  The various methods of treatment have been discussed with the patient and family. After consideration of risks, benefits and other options for treatment, the patient has consented to  Procedure(s): DRAINAGE AND DEBRIDEMENT MRSA OF LEFT BREAST ABSCESS (MASS) WITH ULTRASOUND (Left) as a surgical intervention.  The patient's history has been reviewed, patient examined, no change in status, stable for surgery.  I have reviewed the patient's chart and labs.  Questions were answered to the patient's satisfaction.     [name]

## 2018-11-09 NOTE — Op Note (Signed)
Patient Name:           Kelli Wright   Date of Surgery:        11/09/2018  Pre op Diagnosis:      MRSA abscess left breast  Post op Diagnosis:    Same  Procedure:                 Needle aspiration left breast abscess for culture                                      Incision, debridement, and drainage of abscess left breast  Surgeon:                     Angelia Mould. Derrell Lolling, M.D., FACS  Assistant:                      Or staff  Operative Indications:   The patient is a 23 year old female  who is referred by Dr. Gerome Sam at the [facility] for a left breast abscess that has not responded to standard antibiotic therapy and aspiration. This is an MRSA abscess.      The patient had a nipple ring. That has been removed. She smokes. She has a history of substance abuse.  She lives with her grandmother.  She was referred to the breast center by [facility] [place] on October 26, 2018 having just been started on Bactrim. They found cellulitis with edema in the left breast parenchyma medially but no drainable abscess. When she came back on March 12 they found numerous abscesses some of which may be interconnected. They switched her to doxycycline. They aspirated the abscess. It is MRSA. They saw her again on 11/07/2018 and said that we needed to see her because it was not responding.      Past history reveals daily tobacco use. Uses marijuana and benzodiazepines. TMJ surgery. Depression and anxiety. No major surgery or medical problems       Exam reveals a huge unifocal abscess in the medial left breast. At least 8 cm in size. Not actively draining but very tender and red. This will need to be managed under general anesthesia in the operating room. She has enough doxycycline to last 3 days. We are going to schedule her for ultrasound-guided drainage and debridement of her left breast abscess in the OR   Operative Findings:       There was an 8 cm abscess with  surrounding cellulitis, left breast, medial, 9 o'clock position.  This was not a unilocular abscess.  There were multiple tiny abscesses.  I obtained fluid for culture with needle aspiration.  I made a 4 cm transverse by 2 cm vertical elliptical incision, excising the skin and superficial breast tissues.  I debrided the breast tissue somewhat and make sure that I broke up all the loculations.  I felt that I completely explored and opened up the wound.  Hemostasis was good but took some time with electrocautery as usual.  The wound was irrigated and packed with iodoform gauze.  Dry gauze and a breast binder were placed.  The patient tolerated the procedure well and was taken to the operating room in stable condition.  EBL 20 cc.  Counts correct.  Complications none.  Procedure in Detail:          Following the  induction of LMA anesthesia the patient's left breast was prepped and draped in a sterile fashion.  Antibiotics were withheld temporarily.  Surgical timeout was performed. I obtained fluid for culture with needle aspiration.  I made a 4 cm transverse by 2 cm vertical elliptical incision, excising the skin and superficial breast tissues.  I debrided the breast tissue somewhat and make sure that I broke up all the loculations.  Excised tissue was sent to pathology.  I felt that I completely explored and opened up the wound.  Hemostasis was good but took some time with electrocautery as usual.  The wound was irrigated and packed with iodoform gauze.  Dry gauze and a breast binder were placed.  The patient tolerated the procedure well and was taken to the operating room in stable condition.  EBL 20 cc.  Counts correct.  Complications none.    Angelia Mould. Derrell Lolling, M.D., FACS General and Minimally Invasive Surgery Breast and Colorectal Surgery  11/09/2018 7:59 AM

## 2018-11-09 NOTE — Discharge Instructions (Signed)
Stay at home in the house for 24 hours. Ice pack for 10 minutes at a time, twice an hour  Prescription for Bactroban ointment and doxycycline antibiotic will be given to you be sure to take these as prescribed  Drink lots of fluids  Be sure to see Dr. Derrell Lolling in the office tomorrow for dressing change    Post Anesthesia Home Care Instructions  NO TYLENOL UNTIL AFTER 1240PM 11/09/2018. NO IBUPROFEN UNTIL AFTER 1240PM 11/09/2018.  Activity: Get plenty of rest for the remainder of the day. A responsible individual must stay with you for 24 hours following the procedure.  For the next 24 hours, DO NOT: -Drive a car -Advertising copywriter -Drink alcoholic beverages -Take any medication unless instructed by your physician -Make any legal decisions or sign important papers.  Meals: Start with liquid foods such as gelatin or soup. Progress to regular foods as tolerated. Avoid greasy, spicy, heavy foods. If nausea and/or vomiting occur, drink only clear liquids until the nausea and/or vomiting subsides. Call your physician if vomiting continues.  Special Instructions/Symptoms: Your throat may feel dry or sore from the anesthesia or the breathing tube placed in your throat during surgery. If this causes discomfort, gargle with warm salt water. The discomfort should disappear within 24 hours.  If you had a scopolamine patch placed behind your ear for the management of post- operative nausea and/or vomiting:  1. The medication in the patch is effective for 72 hours, after which it should be removed.  Wrap patch in a tissue and discard in the trash. Wash hands thoroughly with soap and water. 2. You may remove the patch earlier than 72 hours if you experience unpleasant side effects which may include dry mouth, dizziness or visual disturbances. 3. Avoid touching the patch. Wash your hands with soap and water after contact with the patch.

## 2018-11-10 ENCOUNTER — Encounter (HOSPITAL_BASED_OUTPATIENT_CLINIC_OR_DEPARTMENT_OTHER): Payer: Self-pay | Admitting: General Surgery

## 2018-11-14 LAB — AEROBIC/ANAEROBIC CULTURE W GRAM STAIN (SURGICAL/DEEP WOUND)

## 2019-01-29 ENCOUNTER — Telehealth: Payer: Self-pay | Admitting: Physician Assistant

## 2019-01-29 NOTE — Telephone Encounter (Signed)
Error

## 2019-01-31 ENCOUNTER — Telehealth: Payer: Self-pay | Admitting: Physician Assistant

## 2019-01-31 NOTE — Telephone Encounter (Signed)
[name], had left a written message for me stating that I need to send a prescription for Kelli Wright's meds to [facility] and and have the medicine sent to the jail.  Patient is in jail for distributing drugs and will be there for "a while." I asked [name] to get the phone number for the jail, specifically the medical clinic if she has it, so I can call and speak with the nurse, PA, NP, or MD about this.  Of course I do not mind sending the prescription but I want to make sure this is the way they want it handled.  I have not seen Kelli Wright since January when we started Zyprexa 5 mg, and I gave her enough for a month with 1 refill.  She could not have had enough medication to have lasted this long anyway.  So I will see what their medical providers report.

## 2019-02-01 ENCOUNTER — Telehealth: Payer: Self-pay | Admitting: Physician Assistant

## 2019-02-02 ENCOUNTER — Telehealth: Payer: Self-pay | Admitting: Physician Assistant

## 2019-02-02 NOTE — Telephone Encounter (Signed)
Spoke with [name] and she just needed help paying on Laurina's account. Her daughter is going to help her with that but she will call back if there are any issues.

## 2019-02-02 NOTE — Telephone Encounter (Signed)
Tried to reach [place], but went to [employer] is full. Will try back

## 2019-02-02 NOTE — Telephone Encounter (Signed)
Thanks y'all.  If she calls back, needs to talk to [facility] about that.

## 2019-05-23 NOTE — Telephone Encounter (Signed)
done

## 2019-12-19 NOTE — Telephone Encounter (Signed)
error 

## 2023-03-30 ENCOUNTER — Other Ambulatory Visit: Payer: Self-pay

## 2023-03-30 ENCOUNTER — Ambulatory Visit (HOSPITAL_COMMUNITY)
Admission: EM | Admit: 2023-03-30 | Discharge: 2023-03-31 | Disposition: A | Discharge status: Other Acute Inpatient Hospital | Payer: No Payment, Other | Attending: Psychiatry | Admitting: Psychiatry

## 2023-03-30 ENCOUNTER — Encounter (HOSPITAL_COMMUNITY): Payer: Self-pay | Admitting: Behavioral Health

## 2023-03-30 DIAGNOSIS — F319 Bipolar disorder, unspecified: Secondary | ICD-10-CM | POA: Diagnosis not present

## 2023-03-30 DIAGNOSIS — Z5941 Food insecurity: Secondary | ICD-10-CM | POA: Insufficient documentation

## 2023-03-30 DIAGNOSIS — Z91120 Patient's intentional underdosing of medication regimen due to financial hardship: Secondary | ICD-10-CM | POA: Insufficient documentation

## 2023-03-30 DIAGNOSIS — F17290 Nicotine dependence, other tobacco product, uncomplicated: Secondary | ICD-10-CM | POA: Insufficient documentation

## 2023-03-30 DIAGNOSIS — F411 Generalized anxiety disorder: Secondary | ICD-10-CM | POA: Insufficient documentation

## 2023-03-30 DIAGNOSIS — F32A Depression, unspecified: Secondary | ICD-10-CM | POA: Insufficient documentation

## 2023-03-30 DIAGNOSIS — Z5900 Homelessness unspecified: Secondary | ICD-10-CM | POA: Insufficient documentation

## 2023-03-30 DIAGNOSIS — F19188 Other psychoactive substance abuse with other psychoactive substance-induced disorder: Secondary | ICD-10-CM | POA: Insufficient documentation

## 2023-03-30 DIAGNOSIS — T43506A Underdosing of unspecified antipsychotics and neuroleptics, initial encounter: Secondary | ICD-10-CM | POA: Insufficient documentation

## 2023-03-30 DIAGNOSIS — F1994 Other psychoactive substance use, unspecified with psychoactive substance-induced mood disorder: Secondary | ICD-10-CM | POA: Diagnosis not present

## 2023-03-30 DIAGNOSIS — G4700 Insomnia, unspecified: Secondary | ICD-10-CM | POA: Insufficient documentation

## 2023-03-30 DIAGNOSIS — F4310 Post-traumatic stress disorder, unspecified: Secondary | ICD-10-CM | POA: Insufficient documentation

## 2023-03-30 DIAGNOSIS — F1914 Other psychoactive substance abuse with psychoactive substance-induced mood disorder: Secondary | ICD-10-CM | POA: Diagnosis not present

## 2023-03-30 DIAGNOSIS — F1910 Other psychoactive substance abuse, uncomplicated: Secondary | ICD-10-CM | POA: Diagnosis present

## 2023-03-30 LAB — COMPREHENSIVE METABOLIC PANEL
ALT: 24 U/L (ref 0–44)
AST: 21 U/L (ref 15–41)
Albumin: 3.4 g/dL — ABNORMAL LOW (ref 3.5–5.0)
Alkaline Phosphatase: 74 U/L (ref 38–126)
Anion gap: 5 (ref 5–15)
BUN: 5 mg/dL — ABNORMAL LOW (ref 6–20)
CO2: 28 mmol/L (ref 22–32)
Calcium: 9 mg/dL (ref 8.9–10.3)
Chloride: 102 mmol/L (ref 98–111)
Creatinine, Ser: 0.61 mg/dL (ref 0.44–1.00)
GFR, Estimated: >60 mL/min (ref >60)
Glucose, Bld: 109 mg/dL — ABNORMAL HIGH (ref 70–99)
Potassium: 4.2 mmol/L (ref 3.5–5.1)
Sodium: 135 mmol/L (ref 135–145)
Total Bilirubin: 0.4 mg/dL (ref 0.3–1.2)
Total Protein: 7.1 g/dL (ref 6.5–8.1)

## 2023-03-30 LAB — CBC WITH DIFFERENTIAL/PLATELET
Abs Immature Granulocytes: 0.03 10*3/uL (ref 0.00–0.07)
Basophils Absolute: 0 10*3/uL (ref 0.0–0.1)
Basophils Relative: 0 %
Eosinophils Absolute: 0.1 10*3/uL (ref 0.0–0.5)
Eosinophils Relative: 1 %
HCT: 41.7 % (ref 36.0–46.0)
Hemoglobin: 14 g/dL (ref 12.0–15.0)
Immature Granulocytes: 0 %
Lymphocytes Relative: 33 %
Lymphs Abs: 2.5 10*3/uL (ref 0.7–4.0)
MCH: 29.5 pg (ref 26.0–34.0)
MCHC: 33.6 g/dL (ref 30.0–36.0)
MCV: 87.8 fL (ref 80.0–100.0)
Monocytes Absolute: 0.6 10*3/uL (ref 0.1–1.0)
Monocytes Relative: 8 %
Neutro Abs: 4.3 10*3/uL (ref 1.7–7.7)
Neutrophils Relative %: 58 %
Platelets: 355 10*3/uL (ref 150–400)
RBC: 4.75 MIL/uL (ref 3.87–5.11)
RDW: 13.6 % (ref 11.5–15.5)
WBC: 7.5 10*3/uL (ref 4.0–10.5)
nRBC: 0 % (ref 0.0–0.2)

## 2023-03-30 LAB — HEMOGLOBIN A1C
Hgb A1c MFr Bld: 4.9 % (ref 4.8–5.6)
Mean Plasma Glucose: 93.93 mg/dL

## 2023-03-30 LAB — POCT PREGNANCY, URINE: Preg Test, Ur: NEGATIVE

## 2023-03-30 LAB — POCT URINE DRUG SCREEN - MANUAL ENTRY (I-SCREEN)
POC Amphetamine UR: NOT DETECTED
POC Buprenorphine (BUP): NOT DETECTED
POC Cocaine UR: POSITIVE — AB
POC Marijuana UR: POSITIVE — AB
POC Methadone UR: NOT DETECTED
POC Methamphetamine UR: POSITIVE — AB
POC Morphine: POSITIVE — AB
POC Oxazepam (BZO): NOT DETECTED
POC Oxycodone UR: NOT DETECTED
POC Secobarbital (BAR): NOT DETECTED

## 2023-03-30 LAB — LIPID PANEL
Cholesterol: 135 mg/dL (ref 0–200)
HDL: 34 mg/dL — ABNORMAL LOW (ref >40)
LDL Cholesterol: 67 mg/dL (ref 0–99)
Total CHOL/HDL Ratio: 4 RATIO
Triglycerides: 170 mg/dL — ABNORMAL HIGH (ref <150)
VLDL: 34 mg/dL (ref 0–40)

## 2023-03-30 LAB — TSH: TSH: 0.354 u[IU]/mL (ref 0.350–4.500)

## 2023-03-30 LAB — MAGNESIUM: Magnesium: 2.1 mg/dL (ref 1.7–2.4)

## 2023-03-30 LAB — POC URINE PREG, ED

## 2023-03-30 LAB — ETHANOL: Alcohol, Ethyl (B): <10 mg/dL (ref <10)

## 2023-03-30 MED ORDER — CLONIDINE HCL 0.1 MG PO TABS
0.1000 mg | ORAL_TABLET | Freq: Four times a day (QID) | ORAL | Status: DC
  Administered 2023-03-30 – 2023-03-31 (×3): 0.1 mg via ORAL
  Filled 2023-03-30 (×3): qty 1

## 2023-03-30 MED ORDER — ONDANSETRON 4 MG PO TBDP: 4.0000 mg | ORAL_TABLET | Freq: Four times a day (QID) | ORAL | Status: DC | PRN

## 2023-03-30 MED ORDER — DICYCLOMINE HCL 20 MG PO TABS: 20.0000 mg | ORAL_TABLET | Freq: Four times a day (QID) | ORAL | Status: DC | PRN

## 2023-03-30 MED ORDER — METHOCARBAMOL 500 MG PO TABS
500.0000 mg | ORAL_TABLET | Freq: Three times a day (TID) | ORAL | Status: DC | PRN
  Administered 2023-03-30 – 2023-03-31 (×2): 500 mg via ORAL
  Filled 2023-03-30 (×2): qty 1

## 2023-03-30 MED ORDER — CLONIDINE HCL 0.1 MG PO TABS: 0.1000 mg | ORAL_TABLET | Freq: Every day | ORAL | Status: DC

## 2023-03-30 MED ORDER — HYDROXYZINE HCL 25 MG PO TABS
25.0000 mg | ORAL_TABLET | Freq: Four times a day (QID) | ORAL | Status: DC | PRN
  Administered 2023-03-30 – 2023-03-31 (×2): 25 mg via ORAL
  Filled 2023-03-30 (×2): qty 1

## 2023-03-30 MED ORDER — NAPROXEN 500 MG PO TABS
500.0000 mg | ORAL_TABLET | Freq: Two times a day (BID) | ORAL | Status: DC | PRN
  Administered 2023-03-30 – 2023-03-31 (×2): 500 mg via ORAL
  Filled 2023-03-30 (×2): qty 1

## 2023-03-30 MED ORDER — MAGNESIUM HYDROXIDE 400 MG/5ML PO SUSP: 30.0000 mL | Freq: Every day | ORAL | Status: DC | PRN

## 2023-03-30 MED ORDER — TRAZODONE HCL 50 MG PO TABS
50.0000 mg | ORAL_TABLET | Freq: Every evening | ORAL | Status: DC | PRN
  Administered 2023-03-30: 50 mg via ORAL
  Filled 2023-03-30: qty 1

## 2023-03-30 MED ORDER — ALUM & MAG HYDROXIDE-SIMETH 200-200-20 MG/5ML PO SUSP: 30.0000 mL | ORAL | Status: DC | PRN

## 2023-03-30 MED ORDER — ACETAMINOPHEN 325 MG PO TABS
650.0000 mg | ORAL_TABLET | Freq: Four times a day (QID) | ORAL | Status: DC | PRN
  Administered 2023-03-30: 650 mg via ORAL
  Filled 2023-03-30: qty 2

## 2023-03-30 MED ORDER — CLONIDINE HCL 0.1 MG PO TABS: 0.1000 mg | ORAL_TABLET | ORAL | Status: DC

## 2023-03-30 MED ORDER — LOPERAMIDE HCL 2 MG PO CAPS: 2.0000 mg | ORAL_CAPSULE | ORAL | Status: DC | PRN
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
## 2023-03-30 NOTE — ED Notes (Signed)
Patient admitted to obs unit. Patient A&Ox4. Patient endorsing passive SI and denies HI and A/V/H. Patient oriented to unit and provided with meal. Patient med compliant. Patient cooperative on unit and no current distress.

## 2023-03-30 NOTE — Progress Notes (Signed)
   03/30/23 1414  [facility] Triage Screening (Walk-ins at [facility] only)  How Did You Hear About [place]? Self  What Is the Reason for Your Visit/Call Today? Pt presents to [facility] voluntarily unaccompanied seeking substance use treatment. Pt reports she was in prison for 4 years and was released in August of 2023. Pt reports using cocaine (1/2 gram), heroin(1/2 gram) and marijuana(1-2 blunts) daily. Pt reports her last use was today less then half a gram of heroin and 1 "blunt" of marijuana. Pt recalls 1 substance induced seizure 4-5 years ago, but denies any seizures after. Pt reports after being released from prison she was prescribed gabapentin,clonopine, and seroquel by a provider Dr.Headen and she stopped taking these medications in April due to being unable to afford them. Pt reports 14 unintentional overdoses on heroin since being released from prison 1 year ago. Pt states she has also been homeless since April. Pt reports she is diagnosed with anxiety. Pt denies SI/HI and AVH.  How Long Has This Been Causing You Problems? > than 6 months  Have You Recently Had Any Thoughts About Hurting Yourself? No  Are You Planning to Commit Suicide/Harm Yourself At This time? No  Have you Recently Had Thoughts About Hurting Someone Karolee Ohs? No  Are You Planning To Harm Someone At This Time? No  Are you currently experiencing any auditory, visual or other hallucinations? No  Have You Used Any Alcohol or Drugs in the Past 24 Hours? Yes  How long ago did you use Drugs or Alcohol? today  What Did You Use and How Much? less then half a gram of heroin and 1 "blunt" of marijuana  Do you have any current medical co-morbidities that require immediate attention? No  Clinician description of patient physical appearance/behavior: appears to be under the influence, she reports smoking prior to coming into this facility, casually dressed  What Do You Feel Would Help You the Most Today? Alcohol or Drug Use Treatment  If access to [facility] Urgent  Care was not available, would you have sought care in the Emergency Department? No  Determination of Need Urgent (48 hours)  Options For Referral Facility-Based Crisis

## 2023-03-30 NOTE — ED Notes (Signed)
Pt sleeping@this  time no pain or distress noted will continue to monitor for safety
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
## 2023-03-30 NOTE — ED Notes (Signed)
Pt sleeping@this time. Breathing even and unlabored. Will continue to monitor for safety 

## 2023-03-31 ENCOUNTER — Other Ambulatory Visit (HOSPITAL_COMMUNITY)
Admission: EM | Admit: 2023-03-31 | Discharge: 2023-03-31 | Disposition: A | Discharge status: Home / Self Care | Payer: No Payment, Other | Source: Home / Self Care | Admitting: Behavioral Health

## 2023-03-31 ENCOUNTER — Encounter (HOSPITAL_COMMUNITY): Payer: Self-pay | Admitting: Behavioral Health

## 2023-03-31 DIAGNOSIS — Z5900 Homelessness unspecified: Secondary | ICD-10-CM | POA: Diagnosis not present

## 2023-03-31 DIAGNOSIS — T43506A Underdosing of unspecified antipsychotics and neuroleptics, initial encounter: Secondary | ICD-10-CM | POA: Insufficient documentation

## 2023-03-31 DIAGNOSIS — Z91120 Patient's intentional underdosing of medication regimen due to financial hardship: Secondary | ICD-10-CM | POA: Insufficient documentation

## 2023-03-31 DIAGNOSIS — F19188 Other psychoactive substance abuse with other psychoactive substance-induced disorder: Secondary | ICD-10-CM | POA: Insufficient documentation

## 2023-03-31 DIAGNOSIS — F411 Generalized anxiety disorder: Secondary | ICD-10-CM | POA: Insufficient documentation

## 2023-03-31 DIAGNOSIS — F319 Bipolar disorder, unspecified: Secondary | ICD-10-CM | POA: Diagnosis not present

## 2023-03-31 DIAGNOSIS — F17290 Nicotine dependence, other tobacco product, uncomplicated: Secondary | ICD-10-CM | POA: Diagnosis not present

## 2023-03-31 DIAGNOSIS — F1994 Other psychoactive substance use, unspecified with psychoactive substance-induced mood disorder: Secondary | ICD-10-CM | POA: Diagnosis present

## 2023-03-31 DIAGNOSIS — F1910 Other psychoactive substance abuse, uncomplicated: Secondary | ICD-10-CM | POA: Diagnosis present

## 2023-03-31 DIAGNOSIS — F17210 Nicotine dependence, cigarettes, uncomplicated: Secondary | ICD-10-CM | POA: Insufficient documentation

## 2023-03-31 DIAGNOSIS — F1914 Other psychoactive substance abuse with psychoactive substance-induced mood disorder: Secondary | ICD-10-CM | POA: Diagnosis not present

## 2023-03-31 MED ORDER — ACETAMINOPHEN 325 MG PO TABS: 650.0000 mg | ORAL_TABLET | Freq: Four times a day (QID) | ORAL | Status: DC | PRN

## 2023-03-31 MED ORDER — MAGNESIUM HYDROXIDE 400 MG/5ML PO SUSP: 30.0000 mL | Freq: Every day | ORAL | Status: DC | PRN

## 2023-03-31 MED ORDER — CLONIDINE HCL 0.1 MG PO TABS: 0.1000 mg | ORAL_TABLET | ORAL | Status: DC | PRN

## 2023-03-31 MED ORDER — MELATONIN 3 MG PO TABS: 3.0000 mg | ORAL_TABLET | Freq: Every evening | ORAL | Status: DC | PRN

## 2023-03-31 MED ORDER — TRAZODONE HCL 50 MG PO TABS: 50.0000 mg | ORAL_TABLET | Freq: Every evening | ORAL | Status: DC | PRN

## 2023-03-31 MED ORDER — DIPHENHYDRAMINE HCL 25 MG PO CAPS: 25.0000 mg | ORAL_CAPSULE | Freq: Four times a day (QID) | ORAL | Status: DC | PRN

## 2023-03-31 MED ORDER — LORAZEPAM 1 MG PO TABS: 1.0000 mg | ORAL_TABLET | Freq: Four times a day (QID) | ORAL | Status: DC | PRN

## 2023-03-31 MED ORDER — LOPERAMIDE HCL 2 MG PO CAPS: 2.0000 mg | ORAL_CAPSULE | ORAL | Status: DC | PRN

## 2023-03-31 MED ORDER — CLONIDINE HCL 0.1 MG PO TABS: 0.1000 mg | ORAL_TABLET | ORAL | Status: DC

## 2023-03-31 MED ORDER — DICYCLOMINE HCL 20 MG PO TABS: 20.0000 mg | ORAL_TABLET | Freq: Four times a day (QID) | ORAL | Status: DC | PRN

## 2023-03-31 MED ORDER — METHOCARBAMOL 500 MG PO TABS: 500.0000 mg | ORAL_TABLET | Freq: Three times a day (TID) | ORAL | Status: DC | PRN

## 2023-03-31 MED ORDER — ONDANSETRON 4 MG PO TBDP: 4.0000 mg | ORAL_TABLET | Freq: Four times a day (QID) | ORAL | Status: DC | PRN

## 2023-03-31 MED ORDER — NAPROXEN 500 MG PO TABS: 500.0000 mg | ORAL_TABLET | Freq: Two times a day (BID) | ORAL | Status: DC | PRN

## 2023-03-31 MED ORDER — ALUM & MAG HYDROXIDE-SIMETH 200-200-20 MG/5ML PO SUSP: 30.0000 mL | ORAL | Status: DC | PRN

## 2023-03-31 MED ORDER — NICOTINE POLACRILEX 2 MG MT GUM: 2.0000 mg | CHEWING_GUM | OROMUCOSAL | Status: DC | PRN

## 2023-03-31 MED ORDER — CLONIDINE HCL 0.1 MG PO TABS: 0.1000 mg | ORAL_TABLET | Freq: Four times a day (QID) | ORAL | Status: DC

## 2023-03-31 MED ORDER — DIPHENHYDRAMINE HCL 50 MG/ML IJ SOLN: 25.0000 mg | Freq: Four times a day (QID) | INTRAMUSCULAR | Status: DC | PRN

## 2023-03-31 MED ORDER — LORAZEPAM 2 MG/ML IJ SOLN: 1.0000 mg | Freq: Four times a day (QID) | INTRAMUSCULAR | Status: DC | PRN

## 2023-03-31 MED ORDER — ONDANSETRON HCL 4 MG PO TABS: 8.0000 mg | ORAL_TABLET | Freq: Three times a day (TID) | ORAL | Status: DC | PRN

## 2023-03-31 MED ORDER — POLYETHYLENE GLYCOL 3350 17 G PO PACK: 17.0000 g | PACK | Freq: Every day | ORAL | Status: DC | PRN

## 2023-03-31 MED ORDER — CLONIDINE HCL 0.1 MG PO TABS: 0.1000 mg | ORAL_TABLET | Freq: Every day | ORAL | Status: DC

## 2023-03-31 MED ORDER — SENNA 8.6 MG PO TABS: 1.0000 | ORAL_TABLET | Freq: Every evening | ORAL | Status: DC | PRN

## 2023-03-31 MED ORDER — HALOPERIDOL LACTATE 5 MG/ML IJ SOLN: 5.0000 mg | Freq: Four times a day (QID) | INTRAMUSCULAR | Status: DC | PRN

## 2023-03-31 MED ORDER — HYDROXYZINE HCL 25 MG PO TABS: 25.0000 mg | ORAL_TABLET | Freq: Three times a day (TID) | ORAL | Status: DC | PRN

## 2023-03-31 MED ORDER — NICOTINE 7 MG/24HR TD PT24: 7.0000 mg | MEDICATED_PATCH | Freq: Every day | TRANSDERMAL | Status: DC | PRN

## 2023-03-31 MED ORDER — HYDROXYZINE HCL 25 MG PO TABS: 25.0000 mg | ORAL_TABLET | Freq: Four times a day (QID) | ORAL | Status: DC | PRN

## 2023-03-31 MED ORDER — BISMUTH SUBSALICYLATE 262 MG PO CHEW: 524.0000 mg | CHEWABLE_TABLET | ORAL | Status: DC | PRN

## 2023-03-31 MED ORDER — HALOPERIDOL 5 MG PO TABS: 5.0000 mg | ORAL_TABLET | Freq: Four times a day (QID) | ORAL | Status: DC | PRN

## 2023-03-31 MED ORDER — METHOCARBAMOL 500 MG PO TABS
500.0000 mg | ORAL_TABLET | Freq: Three times a day (TID) | ORAL | Status: DC | PRN
  Filled 2023-03-31: qty 1

## 2023-03-31 NOTE — Discharge Instructions (Addendum)
[facility]: Outpatient psychiatric Services:   Please see the walk in hours listed below.  Medication Management New Patient needing Medication Management Walk-in, and Existing Patients needing to see a provider for management coming as a walk in   Monday thru Friday 8:00 AM first come first serve until slots are full.  Recommend being there by 7:15 AM to ensure a slot is open.  Therapy New Patient Therapy Intake and Existing Patients needing to see therapist coming in as a walk in.   Monday, Wednesday, and Thursday morning at 8:00 am first come first serve.  Recommend being there by 7:15 AM to ensure a slot is open.    Every 1st, 2nd, and 3rd Friday at 1:00 PM first come first serve until slots are full.  Will still need to come in that morning at 7:15 AM to get registered for an afternoon slot.  For all walk-ins we ask that you arrive by 7:15 am because patients will be seen in there order of arrival (FIRST COME FIRST SERVE) Availability is limited, therefore you may not be seen on the same day that you walk in if all slots are full.    Our goal is to serve and meet the needs of our community to the best of our ability.    Based on what you have shared, a list of resources for outpatient therapy and psychiatry is provided below to get you started back on treatment.  It is imperative that you follow through with treatment within 5-7 days from the day of discharge to prevent any further risk to your safety or mental well-being.  You are not limited to the list provided.  In case of an urgent crisis, you may contact the Mobile Crisis Unit with [facility] at [phone].        Outpatient Services for Therapy and Medication Management for Medicaid  [facility] [address] [place], [place], [address] [phone] phone  New Patient Assessment/Therapy Walk-ins Monday and Wednesday: 8am until slots are full. Every 1st and  2nd Friday: 1pm - 5pm  NO ASSESSMENT/THERAPY WALK-INS ON TUESDAYS OR THURSDAYS  New Patient Psychiatry/Medication Management Walk-ins Monday-Friday: 8am-11am  For all walk-ins, we ask that you arrive by 7:30am because patient will be seen in the order of arrival.  Availability is limited; therefore, you may not be seen on the same day that you walk-in.  Our goal is to serve and meet the needs of our community to the best of our ability.   [facility] [address] [place], [place], [address] [phone] phone  [facility] [address] [place], [place], [address] [phone] phone [phone] phone (Aetna, AmeriHealth, Anthem/Elevance, BCBS, Centivo, Cigna/Evernorth, ComPsych, Healthy Blue, Medicaid, Optum, [facility], The Alliance, UMR, UHC, Vaya Health, WellCare, Out of Network)  [facility] [address] [place], [place], [address] [phone] phone (Aetna, Anthem/Elevance, [facility] Health Options/Carelon, BCBS, [facility], Humana, Magellan, MedCost, Medicaid, Medicare, MultiPlan, Tricare, UMR, UHC)  [facility] [address] , [place], [address] [phone] phone (Medicaid, ask about other insurance)  [facility] [address] , [place], [address] [phone] phone [phone] fax (Aetna, BCBS , Cigna, Medicaid, [facility] Health Choice, UHC, TRICARE, Self-Pay)  [facility] [address] , [place], [address] [phone] phone (Aetna, Anthem/Elevance, BCBS, [facility], Cigna/Evernorth, Health Choice, Humana, MedCost, Medicaid, Medicare,   Optum, Tricare, UMR, UHC)  [facility] - 6-8 MONTH WAIT FOR THERAPY; SOONER FOR MEDICATION MANAGEMENT [address] [place], [place], [address] [phone] phone (Aetna, AmeriHealth [facility],  BCBS, Cigna, Evernorth, Friday Health Plans, [facility], BCBS Healthy Blue, Humana, Magellan Health, Medcost, Medicare, Medicaid, Optum, Tricare, UHC, UHC Community Plan, Wellcare)  [facility] [address] [place], [place], [address] [phone] phone  [facility] [address] [place], [place], [address] [phone] phone  [facility] [address] [place], [place], [address] [phone] phone  [facility] - THERAPY ONLY [address] [place], [place], [address] [phone] phone  [facility] [address] [place], [place], [address] [phone] phone  [facility] [address] [place], [place], [address] [phone] phone [phone] fax  [facility] [address] [place], [place], [address] [phone] phone [phone] fax  [facility] [address] [place], [place], [address] [phone] phone  [facility] [address] [place], [place], [address]  [phone] phone  [facility]  (Adults Only) [address] [place], [place], [address]  [phone] phone [phone] fax 

## 2023-03-31 NOTE — ED Notes (Signed)
Pt requesting to leave now. Provider notified.

## 2023-03-31 NOTE — ED Notes (Signed)
Pt has requested to leave AMA. Chat sent to providers making them aware but was advised to have pt sign the 72 hour discharge form in which pt is not amenable. This nurse has requested that a provider come speak to pt. She is currently calm and cooperative. Denies any additional needs at this moment. Will continue to monitor for safety.

## 2023-03-31 NOTE — Group Note (Signed)
Group Topic: Understanding Self  Group Date: 03/31/2023 Start Time: 1030 End Time: 1115 Facilitators: Londell Moh, NT  Department: [facility]  Number of Participants: 6  Group Focus: acceptance and daily reflection Treatment Modality:  Psychoeducation Interventions utilized were patient education Purpose: increase insight  Name: Kelli Wright Date of Birth: 1995/12/29  MR: [id number]    Level of Participation: active Quality of Participation: cooperative Interactions with others: gave feedback Mood/Affect: appropriate Triggers (if applicable): n/a Cognition: coherent/clear Progress: Gaining insight Response: Pt was closed off during group as she is new the the unit and is familiarizing herself. She did complete the worksheet she was given. Plan: patient will be encouraged to continue to attend groups  Patients Problems:  Patient Active Problem List   Diagnosis Date Noted   Substance induced mood disorder (HCC) 03/30/2023   Abscess of left breast 11/09/2018   Mixed bipolar I disorder (HCC) 07/06/2018   Aggressive behavior of adult 08/14/2017   Polysubstance abuse (HCC) 08/14/2017   MDD (major depressive disorder), recurrent severe, without psychosis (HCC) 05/12/2016

## 2023-03-31 NOTE — ED Notes (Signed)
Pt sleeping at present, no distress noted.  Monitoring for safety. 

## 2023-03-31 NOTE — ED Notes (Signed)
Pt is awake and alert. She is sitting up in bed.  Given oatmeal and juice.  Denies SI, HI and AVH.  Awaiting MD for assessment.
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
## 2023-03-31 NOTE — ED Notes (Signed)
Pt currently resting quietly.  No distress noted.  Orders obtained to transfer pt to [facility].  Report called to [employer]
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
## 2023-03-31 NOTE — ED Notes (Signed)
Pt sleeping@this time. Breathing even and unlabored. Will continue to monitor for safety 

## 2023-03-31 NOTE — ED Notes (Signed)
Pt was provided dinner.

## 2023-03-31 NOTE — ED Notes (Signed)
Patient Alert and Oriented X 3. Denies suicidal or homicidal ideations or AVH. Patient contracted for safety. Is currently calm and cooperative. Safety of environment ensured. Orientation to the unit completed. Pertinent forms completed. Patient currently in dining area conversing with peers. Will continue to monitor for safety.

## 2023-03-31 NOTE — ED Notes (Signed)
Pt A&O X 4, ambulatory. Discharged in no acute distress. Denies SI/HI/AVH upon discharge. Parents verbalized understanding of discharge paperwork and follow up appointments. Pt escorted to lobby via staff for transport home via [facility]. Safety maintained

## 2023-03-31 NOTE — ED Notes (Signed)
Pt is currently sleeping quietly. Breathing even and unlabored.  Staff will cont to monitor for safety.

## 2023-03-31 NOTE — ED Provider Notes (Signed)
Facility Based Crisis Admission H&P  Date: 03/31/23 Patient Name: Kelli Wright MRN: [id number] Chief Complaint: "detox from drugs"  Diagnoses:  Final diagnoses:  None   HPI:  Kelli Wright is a 27 y.o., female with a past psychiatric history of bipolar I disorder, GAD and substance use history of opioid use disorder, stimulant use disorder, and cannabis use disorder  who presents to the Facility Based Crisis center from behavioral health urgent care [facility]) for evaluation and management of substance detox.   Patient says she is here to detox from drugs. She last used fentanyl at 5 am yesterday and used crack cocaine the day before yesterday. She uses about 1/2 gram heroin, 1/2 gram crack cocaine daily, and smokes 1-2 blunts daily. She smokes cigarettes occasionally but cannot quantify how much.  Patient confirms prior diagnoses of bipolar disorder and generalized anxiety but says she has not been taking anything for these diagnoses as she cannot afford the medications. She does not drink alcohol.  Her plan is to detox from drugs and leave. She has no interest in residential treatment or intensive outpatient follow-up at this time.  At present she is not experiencing withdrawal symptoms.  PHQ 2-9:    Flowsheet Row ED from 03/30/2023 in [facility]  C-SSRS RISK CATEGORY Low Risk        Total Time spent with patient: 1 hour  Musculoskeletal  Strength & Muscle Tone: within normal limits Gait & Station: normal Patient leans: N/A  Psychiatric Specialty Exam  Presentation General Appearance: Casual   Eye Contact:Poor   Speech:Garbled   Speech Volume:Normal   Handedness:Right   Mood and Affect  Mood:-- ("I'm tired")   Affect:Restricted   Thought Process  Thought Processes:Linear; Goal Directed   Descriptions of Associations:Intact   Orientation:Full (Time, Place and Person)   Thought Content:Logical; WDL  Diagnosis of  Schizophrenia or Schizoaffective disorder in past: No    Hallucinations:Hallucinations: None   Ideas of Reference:None   Suicidal Thoughts:Suicidal Thoughts: No   Homicidal Thoughts:Homicidal Thoughts: No   Sensorium  Memory:Immediate Good; Recent Good; Remote Good   Judgment:Good   Insight:Lacking   Executive Functions  Concentration:Good   Attention Span:Good   Recall:Good   Fund of Knowledge:Good   Language:Good   Psychomotor Activity  Psychomotor Activity:Psychomotor Activity: Normal   Assets  Assets:Resilience   Sleep  Sleep:Sleep: Poor Number of Hours of Sleep: 0 ("Not much")   Nutritional Assessment (For OBS and FBC admissions only) Has the patient had a weight loss or gain of 10 pounds or more in the last 3 months?: No Has the patient had a decrease in food intake/or appetite?: No Does the patient have dental problems?: No Does the patient have eating habits or behaviors that may be indicators of an eating disorder including binging or inducing vomiting?: No Has the patient recently lost weight without trying?: 0 Has the patient been eating poorly because of a decreased appetite?: 0 Malnutrition Screening Tool Score: 0    Physical Exam Vitals and nursing note reviewed.  HENT:     Head: Normocephalic and atraumatic.  Pulmonary:     Effort: Pulmonary effort is normal.  Musculoskeletal:     Cervical back: Normal range of motion.  Neurological:     General: No focal deficit present.     Mental Status: She is alert. Mental status is at baseline.    Review of Systems  Constitutional: Negative.   Respiratory: Negative.    Cardiovascular: Negative.  Gastrointestinal: Negative.   Genitourinary: Negative.   Psychiatric/Behavioral:         Psychiatric subjective data addressed in PSE or HPI / daily subjective report   There were no vitals taken for this visit. There is no height or weight on file to calculate BMI.  Past  Psychiatric History: bipolar I disorder, GAD   Is the patient at risk to self? No Has the patient been a risk to self in the past 6 months? No Has the patient been a risk to self within the distant past? Unknown Is the patient a risk to others? No Has the patient been a risk to others in the past 6 months? No Has the patient been a risk to others within the distant past? Unknown      Past Medical History:  Diagnosis Date   Abscess of left breast 11/09/2018   Anxiety     Depression     Dislocation, shoulder closed 08/2013    left - injured in a fall   Herpes     Polysubstance dependence, non-opioid, episodic (HCC)     Sensitive skin     TMJ arthritis    Family History: both mom and dad addicted to "all the drugs" Social History: currently homeless in [place], close to sister but otherwise no other supports. She uses about 1/2 gram heroin, 1/2 gram crack cocaine daily, and smokes 1-2 blunts daily. She smokes cigarettes occasionally but cannot quantify how much  Last Labs:     Latest Ref Rng & Units 03/30/2023    5:10 PM 08/13/2017    9:30 AM 03/22/2016    4:11 PM  CMP  Glucose 70 - 99 mg/dL 161  93  85   BUN 6 - 20 mg/dL 5  11  <5   Creatinine 0.44 - 1.00 mg/dL 0.96  0.45  4.09   Sodium 135 - 145 mmol/L 135  136  138   Potassium 3.5 - 5.1 mmol/L 4.2  3.4  3.1   Chloride 98 - 111 mmol/L 102  103  105   CO2 22 - 32 mmol/L 28  21  26    Calcium 8.9 - 10.3 mg/dL 9.0  9.5  8.9   Total Protein 6.5 - 8.1 g/dL 7.1   7.6   Total Bilirubin 0.3 - 1.2 mg/dL 0.4   1.1   Alkaline Phos 38 - 126 U/L 74   118   AST 15 - 41 U/L 21   23   ALT 0 - 44 U/L 24   32   CBC    Component Value Date/Time   WBC 7.5 03/30/2023 1710   RBC 4.75 03/30/2023 1710   HGB 14.0 03/30/2023 1710   HCT 41.7 03/30/2023 1710   PLT 355 03/30/2023 1710   MCV 87.8 03/30/2023 1710   MCH 29.5 03/30/2023 1710   MCHC 33.6 03/30/2023 1710   RDW 13.6 03/30/2023 1710   LYMPHSABS 2.5 03/30/2023 1710   MONOABS 0.6  03/30/2023 1710   EOSABS 0.1 03/30/2023 1710   BASOSABS 0.0 03/30/2023 1710    Allergies: Keflex [cephalexin]  PTA Medications: (Not in a hospital admission)   Long Term Goals: Improvement in symptoms so as ready for discharge  Short Term Goals: Patient will verbalize feelings in meetings with treatment team members. and Patient will take medications as prescribed daily.  Medical Decision Making  Scheduled -- COWS with as needed clonidine protocol -- ID screening for IVDU -- Patient in need of nicotine replacement; nicotine polacrilex (  gum) and nicotine patch 7 mg / 24 hours ordered. Smoking cessation encouraged  PRNs              -- start acetaminophen 650 mg every 6 hours as needed for mild to moderate pain, fever, and headaches              -- start hydroxyzine 25 mg three times a day as needed for anxiety              -- start bismuth subsalicylate 524 mg oral chewable tablet every 3 hours as needed for diarrhea / loose stools              -- start senna 8.6 mg oral at bedtime and polyethylene glycol 17 g oral daily as needed for mild to moderate constipation              -- start ondansetron 8 mg every 8 hours as needed for nausea or vomiting              -- start aluminum-magnesium hydroxide + simethicone 30 mL every 4 hours as needed for heartburn or indigestion              -- start melatonin 3 mg bedtime as needed for insomnia -- Opiate withdrawal supportive care: as needed loperamide, naproxen, dicyclomine, methocarbamol, and clonidine  -- As needed agitation protocol in-place  Recommendations  Based on my evaluation the patient does not appear to have an emergency medical condition.  Augusto Gamble, MD 03/31/23  1:21 PM

## 2023-05-24 ENCOUNTER — Encounter (HOSPITAL_COMMUNITY): Payer: Self-pay

## 2023-05-24 ENCOUNTER — Ambulatory Visit (HOSPITAL_COMMUNITY): Payer: No Payment, Other | Admitting: Clinical

## 2023-05-25 ENCOUNTER — Encounter (HOSPITAL_COMMUNITY): Payer: Self-pay | Admitting: Psychiatry

## 2023-05-25 ENCOUNTER — Telehealth (HOSPITAL_COMMUNITY): Payer: Self-pay | Admitting: Licensed Clinical Social Worker

## 2023-05-25 ENCOUNTER — Ambulatory Visit (INDEPENDENT_AMBULATORY_CARE_PROVIDER_SITE_OTHER): Payer: No Payment, Other | Admitting: Psychiatry

## 2023-05-25 DIAGNOSIS — F1994 Other psychoactive substance use, unspecified with psychoactive substance-induced mood disorder: Secondary | ICD-10-CM

## 2023-05-25 DIAGNOSIS — F411 Generalized anxiety disorder: Secondary | ICD-10-CM

## 2023-05-25 MED ORDER — GABAPENTIN 100 MG PO CAPS
100.0000 mg | ORAL_CAPSULE | Freq: Three times a day (TID) | ORAL | 3 refills | Status: AC
Start: 2023-05-25 — End: ?

## 2023-05-25 MED ORDER — HYDROXYZINE HCL 10 MG PO TABS
10.0000 mg | ORAL_TABLET | Freq: Three times a day (TID) | ORAL | 3 refills | Status: AC | PRN
Start: 2023-05-25 — End: ?

## 2023-05-25 MED ORDER — QUETIAPINE FUMARATE 50 MG PO TABS
50.0000 mg | ORAL_TABLET | Freq: Every day | ORAL | 3 refills | Status: AC
Start: 2023-05-25 — End: ?
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
## 2023-05-25 NOTE — Telephone Encounter (Signed)
The therapist calls Garnet explaining that he is calling from [facility] at the request of Ms. Gretchen Short. Caisley says that she does not have time to talk at the moment as she is waiting on a ride so asks that this therapist call her tomorrow around noon or later.  Myrna Blazer, MA, LCSW, [facility], LCAS 05/25/2023

## 2023-05-26 ENCOUNTER — Telehealth (HOSPITAL_COMMUNITY): Payer: Self-pay

## 2023-05-26 NOTE — Telephone Encounter (Signed)
This therapist calls Kelli Wright today as she did not have time to talk when Kelli Blazer, LCSW, [facility], LCAS called her. On yesterday.  This therapistsdiscusses [facility] services and individual therapy for substance use.  Kelli Wright says she is not against the group, but for now she is not able to get transportation 3 times per week.  Kelli Wright says she is homeless but thinks she can get transportation for individual therapy. This therapist has scheduled Kelli Wright for Tuesday, 05-31-23 to do a CCA and began individual therapy.  Remigio Eisenmenger, MS, LMFT, LCAS 05-26-2023

## 2023-05-31 ENCOUNTER — Ambulatory Visit (HOSPITAL_COMMUNITY): Payer: No Payment, Other

## 2023-05-31 ENCOUNTER — Telehealth (HOSPITAL_COMMUNITY): Payer: Self-pay

## 2023-05-31 NOTE — Telephone Encounter (Signed)
Kelli Wright was a No Show for her appointment today.  Therapist called her and reached voice mail. Therapist left a HIPAA compliant message requesting a return call.  Remigio Eisenmenger, MS., LMFT, LCAS 05-31-23

## 2023-07-19 ENCOUNTER — Ambulatory Visit (HOSPITAL_COMMUNITY): Payer: No Payment, Other | Admitting: Clinical

## 2023-08-10 ENCOUNTER — Telehealth (HOSPITAL_COMMUNITY): Payer: No Payment, Other | Admitting: Psychiatry

## 2023-08-10 ENCOUNTER — Encounter (HOSPITAL_COMMUNITY): Payer: Self-pay
